# Patient Record
Sex: Male | Born: 1940
Health system: Southern US, Community
[De-identification: ages and names within clinical notes are randomized; demographics above are authoritative.]

## PROBLEM LIST (undated history)

## (undated) DIAGNOSIS — F4310 Post-traumatic stress disorder, unspecified: Secondary | ICD-10-CM

## (undated) DIAGNOSIS — M7541 Impingement syndrome of right shoulder: Secondary | ICD-10-CM

## (undated) DIAGNOSIS — R0602 Shortness of breath: Secondary | ICD-10-CM

## (undated) DIAGNOSIS — J189 Pneumonia, unspecified organism: Secondary | ICD-10-CM

## (undated) DIAGNOSIS — J449 Chronic obstructive pulmonary disease, unspecified: Secondary | ICD-10-CM

## (undated) DIAGNOSIS — F329 Major depressive disorder, single episode, unspecified: Secondary | ICD-10-CM

## (undated) DIAGNOSIS — F32A Depression, unspecified: Secondary | ICD-10-CM

## (undated) DIAGNOSIS — M1990 Unspecified osteoarthritis, unspecified site: Secondary | ICD-10-CM

## (undated) DIAGNOSIS — Z9981 Dependence on supplemental oxygen: Secondary | ICD-10-CM

## (undated) DIAGNOSIS — M19019 Primary osteoarthritis, unspecified shoulder: Secondary | ICD-10-CM

## (undated) HISTORY — PX: SUBDURAL HEMATOMA EVACUATION VIA CRANIOTOMY: SUR319

## (undated) HISTORY — PX: HEMORRHOID SURGERY: SHX153

## (undated) HISTORY — PX: KNEE ARTHROSCOPY: SUR90

## (undated) HISTORY — PX: COLONOSCOPY: SHX174

## (undated) HISTORY — PX: SHOULDER SURGERY: SHX246

## (undated) HISTORY — PX: FOOT SURGERY: SHX648

## (undated) HISTORY — PX: CARPAL TUNNEL RELEASE: SHX101

## (undated) HISTORY — PX: CERVICAL FUSION: SHX112

## (undated) HISTORY — PX: NOSE SURGERY: SHX723

## (undated) HISTORY — PX: APPENDECTOMY: SHX54

## (undated) HISTORY — PX: OTHER SURGICAL HISTORY: SHX169

## (undated) HISTORY — PX: EYE SURGERY: SHX253

## (undated) HISTORY — PX: PILONIDAL CYST / SINUS EXCISION: SUR543

---

## 2011-03-09 ENCOUNTER — Other Ambulatory Visit: Payer: Self-pay | Admitting: Orthopedic Surgery

## 2011-03-09 ENCOUNTER — Encounter (HOSPITAL_BASED_OUTPATIENT_CLINIC_OR_DEPARTMENT_OTHER)
Admission: RE | Admit: 2011-03-09 | Discharge: 2011-03-09 | Disposition: A | Discharge status: Home / Self Care | Payer: Medicare Other | Source: Ambulatory Visit | Attending: Orthopedic Surgery | Admitting: Orthopedic Surgery

## 2011-03-09 ENCOUNTER — Ambulatory Visit
Admission: RE | Admit: 2011-03-09 | Discharge: 2011-03-09 | Disposition: A | Discharge status: Home / Self Care | Payer: Medicare Other | Source: Ambulatory Visit | Attending: Orthopedic Surgery | Admitting: Orthopedic Surgery

## 2011-03-09 DIAGNOSIS — Z01811 Encounter for preprocedural respiratory examination: Secondary | ICD-10-CM

## 2011-03-12 ENCOUNTER — Ambulatory Visit (HOSPITAL_BASED_OUTPATIENT_CLINIC_OR_DEPARTMENT_OTHER)
Admission: RE | Admit: 2011-03-12 | Discharge: 2011-03-12 | Disposition: A | Discharge status: Home / Self Care | Payer: Medicare Other | Source: Ambulatory Visit | Attending: Orthopedic Surgery | Admitting: Orthopedic Surgery

## 2011-03-12 DIAGNOSIS — G5600 Carpal tunnel syndrome, unspecified upper limb: Secondary | ICD-10-CM | POA: Insufficient documentation

## 2011-03-12 DIAGNOSIS — Z01812 Encounter for preprocedural laboratory examination: Secondary | ICD-10-CM | POA: Insufficient documentation

## 2011-03-12 DIAGNOSIS — Z01818 Encounter for other preprocedural examination: Secondary | ICD-10-CM | POA: Insufficient documentation

## 2011-03-12 DIAGNOSIS — J449 Chronic obstructive pulmonary disease, unspecified: Secondary | ICD-10-CM | POA: Insufficient documentation

## 2011-03-12 DIAGNOSIS — Z01810 Encounter for preprocedural cardiovascular examination: Secondary | ICD-10-CM | POA: Insufficient documentation

## 2011-03-12 DIAGNOSIS — J4489 Other specified chronic obstructive pulmonary disease: Secondary | ICD-10-CM | POA: Insufficient documentation

## 2011-03-18 NOTE — Op Note (Signed)
NAME:  [name] [name], [name] [name]           ACCOUNT NO.:  [id number]  MEDICAL RECORD NO.:  [id number]  LOCATION:                                 FACILITY:  PHYSICIAN:  [name] [name]. Keith Felten, M.D.      DATE OF BIRTH:  DATE OF PROCEDURE:  03/12/2011 DATE OF DISCHARGE:                              OPERATIVE REPORT   PREOPERATIVE DIAGNOSIS:  Chronic severe entrapment neuropathy, right median nerve at carpal tunnel.  POSTOPERATIVE DIAGNOSIS:  Chronic severe entrapment neuropathy, right median nerve at carpal tunnel.  OPERATION:  Release of right transverse carpal ligament.  OPERATING SURGEON:  [name] [name]. [name] Roddy, MD  ASSISTANT:  [name] Reeks Dasnoit, PA-C  ANESTHESIA:  General by LMA.  SUPERVISING ANESTHESIOLOGIST:  [name] Silvan, MD  INDICATIONS:  [name] [name] is a 69 year old gentleman referred to Dr. Sherril [name] of [name], [name] for evaluation and management of chronic hand numbness.  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  Years ago he had a left carpal tunnel release.  During the past 5 years, he has had progressive right hand numbness.  He brought these symptoms to the attention of  Dr. Sherril [name] of [name] and was referred for detailed electrodiagnostic studies.  These revealed severe right carpal tunnel syndrome.  He was referred for an upper extremity orthopedic consult.  Clinical examination confirmed the presence of severe right carpal tunnel syndrome with marked prolongation of motor and sensory latencies and weakness of the thenar musculature.  We advised proceeding with release of the right transverse carpal ligament through small incision technique.  After informed consent, he is brought to the operating room at this time.  PROCEDURE:  [name] [name] was brought to room 1 of the [name] and placed in supine position on the operating table.  Following the induction of general  anesthesia by LMA technique, the right arm was prepped with Betadine soap and solution and sterilely draped.  Following routine surgical time-out, the right arm was exsanguinated with an Esmarch bandage and an arterial tourniquet on the proximal right brachium inflated to 250 mmHg.  Procedure commenced with a short incision in the line of the ring finger in the palm.  Subcutaneous tissues were carefully divided revealing the palmar fascia.  This was split longitudinally to reveal the common sensory branch of the median nerve.  These were followed back to the transverse carpal ligament which was gently isolated from the median nerve proper with the aid of a Penfield 4 Engineer, structural.  Once the pathway was created, superficial and deep to the transverse carpal ligament  scissors were used to release the ligament into the distal forearm widely opening the carpal canal.  An accessory volar carpal ligament was released with scissors under direct vision.  This fully decompressed the median nerve.  The tenosynovium of the ulnar bursa was quite thick and fibrotic.  Bleeding points along the margin of the released ligament were electrocauterized with bipolar current followed by repair of the skin with intradermal 3-0 Prolene suture.  A compressive dressing was applied with a volar plaster splint maintaining the wrist in 5 degrees of dorsiflexion.  For  aftercare, Mr. [name] [name] was advised to elevate his hand for the next 48 hours.  He is provided Percocet 7.5 mg 1 p.o. q.4-6 h. p.r.n. pain 24 tablets without refill.  We will see him back for followup in our office in a week for dressing change, probable suture removal, and instruction in his postoperative rehabilitation exercise program.     [name] [name]. [name] [name], M.D.     RVS/MEDQ  D:  03/12/2011  T:  03/12/2011  Job:  [id number]  cc:   [name] Beam, MD  Electronically Signed by [name] [name] M.D. on 03/18/2011 07:55:44 AM

## 2011-06-02 HISTORY — PX: JOINT REPLACEMENT: SHX530

## 2011-07-23 ENCOUNTER — Other Ambulatory Visit: Payer: Self-pay | Admitting: Orthopedic Surgery

## 2011-10-07 ENCOUNTER — Encounter (HOSPITAL_COMMUNITY): Payer: Self-pay | Admitting: Pharmacy Technician

## 2011-10-12 ENCOUNTER — Encounter (HOSPITAL_COMMUNITY): Payer: Self-pay

## 2011-10-12 ENCOUNTER — Encounter (HOSPITAL_COMMUNITY)
Admission: RE | Admit: 2011-10-12 | Discharge: 2011-10-12 | Disposition: A | Discharge status: Home / Self Care | Payer: Medicare Other | Source: Ambulatory Visit | Attending: Orthopedic Surgery | Admitting: Orthopedic Surgery

## 2011-10-12 HISTORY — DX: Major depressive disorder, single episode, unspecified: F32.9

## 2011-10-12 HISTORY — DX: Depression, unspecified: F32.A

## 2011-10-12 HISTORY — DX: Chronic obstructive pulmonary disease, unspecified: J44.9

## 2011-10-12 HISTORY — DX: Unspecified osteoarthritis, unspecified site: M19.90

## 2011-10-12 HISTORY — DX: Shortness of breath: R06.02

## 2011-10-12 LAB — CBC
HCT: 45.5 % (ref 39.0–52.0)
Hemoglobin: 14.9 g/dL (ref 13.0–17.0)
MCH: 30.1 pg (ref 26.0–34.0)
MCHC: 32.7 g/dL (ref 30.0–36.0)

## 2011-10-12 LAB — COMPREHENSIVE METABOLIC PANEL
BUN: 17 mg/dL (ref 6–23)
Calcium: 9.4 mg/dL (ref 8.4–10.5)
GFR calc Af Amer: >90 mL/min (ref >90)
Glucose, Bld: 98 mg/dL (ref 70–99)
Sodium: 141 mEq/L (ref 135–145)
Total Protein: 7.4 g/dL (ref 6.0–8.3)

## 2011-10-12 LAB — URINALYSIS, ROUTINE W REFLEX MICROSCOPIC
Leukocytes, UA: NEGATIVE
Nitrite: NEGATIVE
Protein, ur: NEGATIVE mg/dL
Urobilinogen, UA: 0.2 mg/dL (ref 0.0–1.0)

## 2011-10-12 LAB — PROTIME-INR
INR: 0.95 (ref 0.00–1.49)
Prothrombin Time: 12.9 seconds (ref 11.6–15.2)

## 2011-10-12 LAB — SURGICAL PCR SCREEN: MRSA, PCR: NEGATIVE

## 2011-10-12 NOTE — Pre-Procedure Instructions (Signed)
10/12 CXR and EKG-EPIC

## 2011-10-12 NOTE — Patient Instructions (Signed)
20 [name] [name]  10/12/2011   Your procedure is scheduled on:  10/19/11   Report to [facility] [place] at  AM.  Call this number if you have problems the morning of surgery: [phone]   Remember:   Do not eat food:After Midnight.  May have clear liquids:until Midnight .    Take these medicines the morning of surgery with A SIP OF WATER:   Do not wear jewelry,.  Do not wear lotions, powders, or perfumes.   Do not shave 48 hours prior to surgery. Men may shave face and neck.  Do not bring valuables to the hospital.  Contacts, dentures or bridgework may not be worn into surgery.  Leave suitcase in the car. After surgery it may be brought to your room.  For patients admitted to the hospital, checkout time is 11:00 AM the day of discharge.      Special Instructions: CHG Shower Use Special Wash: 1/2 bottle night before surgery and 1/2 bottle morning of surgery. shower chin to toes with CHG.  Wash face and private parts with regular soap.     Please read over the following fact sheets that you were given: MRSA Information, coughing and deep breathing exercises, leg exercises, Blood Transfusion Fact sheet, Incentive Spirometry Fact Sheet
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
## 2011-10-16 NOTE — Pre-Procedure Instructions (Signed)
10/16/11 Talked with patient and instructed patient that he could take Vicodin if needed am of surgery with sip of water if needed. Patient voiced understanding.

## 2011-10-19 ENCOUNTER — Encounter (HOSPITAL_COMMUNITY): Payer: Self-pay | Admitting: *Deleted

## 2011-10-19 ENCOUNTER — Ambulatory Visit (HOSPITAL_COMMUNITY): Payer: Medicare Other | Admitting: *Deleted

## 2011-10-19 ENCOUNTER — Encounter (HOSPITAL_COMMUNITY)
Admission: RE | Disposition: A | Discharge status: Home Health | Payer: Self-pay | Source: Ambulatory Visit | Attending: Orthopedic Surgery

## 2011-10-19 ENCOUNTER — Inpatient Hospital Stay (HOSPITAL_COMMUNITY)
Admission: RE | Admit: 2011-10-19 | Discharge: 2011-10-22 | DRG: 470 | Disposition: A | Discharge status: Home Health | Payer: Medicare Other | Source: Ambulatory Visit | Attending: Orthopedic Surgery | Admitting: Orthopedic Surgery

## 2011-10-19 DIAGNOSIS — J449 Chronic obstructive pulmonary disease, unspecified: Secondary | ICD-10-CM | POA: Diagnosis present

## 2011-10-19 DIAGNOSIS — F3289 Other specified depressive episodes: Secondary | ICD-10-CM | POA: Diagnosis present

## 2011-10-19 DIAGNOSIS — J4489 Other specified chronic obstructive pulmonary disease: Secondary | ICD-10-CM | POA: Diagnosis present

## 2011-10-19 DIAGNOSIS — Z96659 Presence of unspecified artificial knee joint: Secondary | ICD-10-CM

## 2011-10-19 DIAGNOSIS — Z01812 Encounter for preprocedural laboratory examination: Secondary | ICD-10-CM

## 2011-10-19 DIAGNOSIS — D649 Anemia, unspecified: Secondary | ICD-10-CM | POA: Diagnosis not present

## 2011-10-19 DIAGNOSIS — F329 Major depressive disorder, single episode, unspecified: Secondary | ICD-10-CM | POA: Diagnosis present

## 2011-10-19 DIAGNOSIS — M1710 Unilateral primary osteoarthritis, unspecified knee: Secondary | ICD-10-CM | POA: Diagnosis present

## 2011-10-19 HISTORY — PX: TOTAL KNEE ARTHROPLASTY: SHX125

## 2011-10-19 LAB — ABO/RH: ABO/RH(D): A NEG

## 2011-10-19 SURGERY — ARTHROPLASTY, KNEE, TOTAL
Anesthesia: Spinal | Site: Knee | Laterality: Left | Wound class: Clean

## 2011-10-19 MED ORDER — DEXTROSE-NACL 5-0.9 % IV SOLN
INTRAVENOUS | Status: DC
  Administered 2011-10-19: 18:00:00 via INTRAVENOUS
  Administered 2011-10-20: 20 mL/h via INTRAVENOUS
  Administered 2011-10-21: 01:00:00 via INTRAVENOUS

## 2011-10-19 MED ORDER — LACTATED RINGERS IV SOLN
INTRAVENOUS | Status: DC
  Administered 2011-10-19: 1000 mL via INTRAVENOUS
  Administered 2011-10-19: 15:00:00 via INTRAVENOUS

## 2011-10-19 MED ORDER — LORATADINE 10 MG PO TABS
10.0000 mg | ORAL_TABLET | Freq: Every day | ORAL | Status: DC
  Administered 2011-10-19 – 2011-10-22 (×4): 10 mg via ORAL
  Filled 2011-10-19 (×4): qty 1

## 2011-10-19 MED ORDER — PROPOFOL 10 MG/ML IV EMUL
INTRAVENOUS | Status: DC | PRN
  Administered 2011-10-19: 140 ug/kg/min via INTRAVENOUS

## 2011-10-19 MED ORDER — ONDANSETRON HCL 4 MG PO TABS: 4.0000 mg | ORAL_TABLET | Freq: Four times a day (QID) | ORAL | Status: DC | PRN

## 2011-10-19 MED ORDER — METOCLOPRAMIDE HCL 10 MG PO TABS: 5.0000 mg | ORAL_TABLET | Freq: Three times a day (TID) | ORAL | Status: DC | PRN

## 2011-10-19 MED ORDER — ONDANSETRON HCL 4 MG/2ML IJ SOLN: 4.0000 mg | Freq: Four times a day (QID) | INTRAMUSCULAR | Status: DC | PRN

## 2011-10-19 MED ORDER — PAROXETINE HCL 20 MG PO TABS
20.0000 mg | ORAL_TABLET | Freq: Every day | ORAL | Status: DC
  Administered 2011-10-20 – 2011-10-22 (×3): 20 mg via ORAL
  Filled 2011-10-19 (×4): qty 1

## 2011-10-19 MED ORDER — METHOCARBAMOL 500 MG PO TABS
500.0000 mg | ORAL_TABLET | Freq: Four times a day (QID) | ORAL | Status: DC | PRN
  Administered 2011-10-20 – 2011-10-22 (×6): 500 mg via ORAL
  Filled 2011-10-19 (×6): qty 1

## 2011-10-19 MED ORDER — MENTHOL 3 MG MT LOZG
1.0000 | LOZENGE | OROMUCOSAL | Status: DC | PRN
  Filled 2011-10-19 (×2): qty 9

## 2011-10-19 MED ORDER — CHLORHEXIDINE GLUCONATE 4 % EX LIQD
60.0000 mL | Freq: Once | CUTANEOUS | Status: DC
  Filled 2011-10-19: qty 60

## 2011-10-19 MED ORDER — DOCUSATE SODIUM 100 MG PO CAPS
100.0000 mg | ORAL_CAPSULE | Freq: Two times a day (BID) | ORAL | Status: DC
  Administered 2011-10-19 – 2011-10-22 (×6): 100 mg via ORAL

## 2011-10-19 MED ORDER — RIVAROXABAN 10 MG PO TABS
10.0000 mg | ORAL_TABLET | Freq: Every day | ORAL | Status: DC
  Administered 2011-10-20 – 2011-10-22 (×3): 10 mg via ORAL
  Filled 2011-10-19 (×4): qty 1

## 2011-10-19 MED ORDER — MORPHINE SULFATE (PF) 1 MG/ML IV SOLN
INTRAVENOUS | Status: DC
  Administered 2011-10-19 (×2): 1 mg via INTRAVENOUS
  Administered 2011-10-20: 3 mg via INTRAVENOUS
  Administered 2011-10-20: 6 mg via INTRAVENOUS
  Administered 2011-10-20: 8 mg via INTRAVENOUS

## 2011-10-19 MED ORDER — TEMAZEPAM 15 MG PO CAPS: 15.0000 mg | ORAL_CAPSULE | Freq: Every evening | ORAL | Status: DC | PRN

## 2011-10-19 MED ORDER — FEXOFENADINE-PSEUDOEPHED ER 60-120 MG PO TB12: 1.0000 | ORAL_TABLET | Freq: Two times a day (BID) | ORAL | Status: DC

## 2011-10-19 MED ORDER — PHENOL 1.4 % MT LIQD
1.0000 | OROMUCOSAL | Status: DC | PRN
  Filled 2011-10-19: qty 177

## 2011-10-19 MED ORDER — ACETAMINOPHEN 325 MG PO TABS: 650.0000 mg | ORAL_TABLET | Freq: Four times a day (QID) | ORAL | Status: DC | PRN

## 2011-10-19 MED ORDER — CEFAZOLIN SODIUM 1-5 GM-% IV SOLN
1.0000 g | Freq: Four times a day (QID) | INTRAVENOUS | Status: AC
  Administered 2011-10-19 – 2011-10-20 (×3): 1 g via INTRAVENOUS
  Filled 2011-10-19 (×3): qty 50

## 2011-10-19 MED ORDER — DEXTROSE 5 % IV SOLN
500.0000 mg | Freq: Four times a day (QID) | INTRAVENOUS | Status: DC | PRN
  Filled 2011-10-19: qty 5

## 2011-10-19 MED ORDER — MIDAZOLAM HCL 5 MG/5ML IJ SOLN
INTRAMUSCULAR | Status: DC | PRN
  Administered 2011-10-19: 2 mg via INTRAVENOUS

## 2011-10-19 MED ORDER — PSEUDOEPHEDRINE HCL ER 120 MG PO TB12
120.0000 mg | ORAL_TABLET | Freq: Two times a day (BID) | ORAL | Status: DC
  Administered 2011-10-19 – 2011-10-22 (×6): 120 mg via ORAL
  Filled 2011-10-19 (×7): qty 1

## 2011-10-19 MED ORDER — TIOTROPIUM BROMIDE MONOHYDRATE 18 MCG IN CAPS
18.0000 ug | ORAL_CAPSULE | Freq: Every day | RESPIRATORY_TRACT | Status: DC
  Administered 2011-10-19 – 2011-10-22 (×4): 18 ug via RESPIRATORY_TRACT
  Filled 2011-10-19: qty 5

## 2011-10-19 MED ORDER — BUPIVACAINE IN DEXTROSE 0.75-8.25 % IT SOLN
INTRATHECAL | Status: DC | PRN
  Administered 2011-10-19: 10 mg via INTRATHECAL

## 2011-10-19 MED ORDER — DIPHENHYDRAMINE HCL 12.5 MG/5ML PO ELIX: 12.5000 mg | ORAL_SOLUTION | ORAL | Status: DC | PRN

## 2011-10-19 MED ORDER — POLYETHYLENE GLYCOL 3350 17 G PO PACK: 17.0000 g | PACK | Freq: Every day | ORAL | Status: DC | PRN

## 2011-10-19 MED ORDER — SODIUM CHLORIDE 0.9 % IJ SOLN: 9.0000 mL | INTRAMUSCULAR | Status: DC | PRN

## 2011-10-19 MED ORDER — ACETAMINOPHEN 650 MG RE SUPP: 650.0000 mg | Freq: Four times a day (QID) | RECTAL | Status: DC | PRN

## 2011-10-19 MED ORDER — SODIUM CHLORIDE 0.9 % IV SOLN: INTRAVENOUS | Status: DC

## 2011-10-19 MED ORDER — ACETAMINOPHEN 10 MG/ML IV SOLN
INTRAVENOUS | Status: AC
  Filled 2011-10-19: qty 100

## 2011-10-19 MED ORDER — DIPHENHYDRAMINE HCL 50 MG/ML IJ SOLN: 12.5000 mg | Freq: Four times a day (QID) | INTRAMUSCULAR | Status: DC | PRN

## 2011-10-19 MED ORDER — FLUTICASONE-SALMETEROL 115-21 MCG/ACT IN AERO
2.0000 | INHALATION_SPRAY | Freq: Two times a day (BID) | RESPIRATORY_TRACT | Status: DC
  Filled 2011-10-19: qty 8

## 2011-10-19 MED ORDER — MORPHINE SULFATE (PF) 1 MG/ML IV SOLN
INTRAVENOUS | Status: AC
  Administered 2011-10-20: 3.46 mg via INTRAVENOUS
  Filled 2011-10-19: qty 25

## 2011-10-19 MED ORDER — ACETAMINOPHEN 10 MG/ML IV SOLN
1000.0000 mg | Freq: Four times a day (QID) | INTRAVENOUS | Status: AC
  Administered 2011-10-19 – 2011-10-20 (×4): 1000 mg via INTRAVENOUS
  Filled 2011-10-19 (×6): qty 100

## 2011-10-19 MED ORDER — BUPIVACAINE ON-Q PAIN PUMP (FOR ORDER SET NO CHG)
INJECTION | Status: DC
  Filled 2011-10-19: qty 1

## 2011-10-19 MED ORDER — BISACODYL 10 MG RE SUPP: 10.0000 mg | Freq: Every day | RECTAL | Status: DC | PRN

## 2011-10-19 MED ORDER — OXYCODONE HCL 5 MG PO TABS
5.0000 mg | ORAL_TABLET | ORAL | Status: DC | PRN
  Administered 2011-10-20 (×2): 10 mg via ORAL
  Administered 2011-10-20: 5 mg via ORAL
  Administered 2011-10-21 – 2011-10-22 (×5): 10 mg via ORAL
  Administered 2011-10-22: 5 mg via ORAL
  Administered 2011-10-22: 10 mg via ORAL
  Filled 2011-10-19 (×6): qty 2
  Filled 2011-10-19 (×2): qty 1
  Filled 2011-10-19 (×2): qty 2

## 2011-10-19 MED ORDER — DEXAMETHASONE SODIUM PHOSPHATE 10 MG/ML IJ SOLN
INTRAMUSCULAR | Status: DC | PRN
  Administered 2011-10-19: 10 mg via INTRAVENOUS

## 2011-10-19 MED ORDER — ACETAMINOPHEN 10 MG/ML IV SOLN
1000.0000 mg | Freq: Once | INTRAVENOUS | Status: AC
  Administered 2011-10-19: 1000 mg via INTRAVENOUS
  Filled 2011-10-19: qty 100

## 2011-10-19 MED ORDER — CEFAZOLIN SODIUM-DEXTROSE 2-3 GM-% IV SOLR
INTRAVENOUS | Status: AC
  Filled 2011-10-19: qty 50

## 2011-10-19 MED ORDER — HETASTARCH-ELECTROLYTES 6 % IV SOLN
INTRAVENOUS | Status: DC | PRN
  Administered 2011-10-19: 14:00:00 via INTRAVENOUS

## 2011-10-19 MED ORDER — BUPIVACAINE 0.25 % ON-Q PUMP SINGLE CATH 300ML
300.0000 mL | INJECTION | Status: DC
  Filled 2011-10-19: qty 300

## 2011-10-19 MED ORDER — METOCLOPRAMIDE HCL 5 MG/ML IJ SOLN: 5.0000 mg | Freq: Three times a day (TID) | INTRAMUSCULAR | Status: DC | PRN

## 2011-10-19 MED ORDER — ZOLPIDEM TARTRATE 5 MG PO TABS
5.0000 mg | ORAL_TABLET | Freq: Every evening | ORAL | Status: DC | PRN
  Administered 2011-10-22: 5 mg via ORAL
  Filled 2011-10-19: qty 1

## 2011-10-19 MED ORDER — HYDROMORPHONE HCL PF 1 MG/ML IJ SOLN: 0.2500 mg | INTRAMUSCULAR | Status: DC | PRN

## 2011-10-19 MED ORDER — DIPHENHYDRAMINE HCL 12.5 MG/5ML PO ELIX: 12.5000 mg | ORAL_SOLUTION | Freq: Four times a day (QID) | ORAL | Status: DC | PRN

## 2011-10-19 MED ORDER — BUPIVACAINE 0.25 % ON-Q PUMP SINGLE CATH 300ML
INJECTION | Status: AC
  Filled 2011-10-19: qty 300

## 2011-10-19 MED ORDER — FENTANYL CITRATE 0.05 MG/ML IJ SOLN
INTRAMUSCULAR | Status: DC | PRN
  Administered 2011-10-19: 50 ug via INTRAVENOUS
  Administered 2011-10-19: 100 ug via INTRAVENOUS

## 2011-10-19 MED ORDER — ALBUTEROL SULFATE (5 MG/ML) 0.5% IN NEBU
2.5000 mg | INHALATION_SOLUTION | Freq: Four times a day (QID) | RESPIRATORY_TRACT | Status: DC | PRN
  Administered 2011-10-21: 2.5 mg via RESPIRATORY_TRACT
  Filled 2011-10-19: qty 0.5

## 2011-10-19 MED ORDER — ONDANSETRON HCL 4 MG/2ML IJ SOLN
INTRAMUSCULAR | Status: DC | PRN
  Administered 2011-10-19: 4 mg via INTRAVENOUS

## 2011-10-19 MED ORDER — DEXAMETHASONE SODIUM PHOSPHATE 10 MG/ML IJ SOLN
10.0000 mg | Freq: Once | INTRAMUSCULAR | Status: DC
  Filled 2011-10-19: qty 1

## 2011-10-19 MED ORDER — CEFAZOLIN SODIUM-DEXTROSE 2-3 GM-% IV SOLR
2.0000 g | INTRAVENOUS | Status: AC
  Administered 2011-10-19: 2 g via INTRAVENOUS

## 2011-10-19 MED ORDER — SODIUM CHLORIDE 0.9 % IR SOLN
Status: DC | PRN
  Administered 2011-10-19: 1000 mL

## 2011-10-19 MED ORDER — NALOXONE HCL 0.4 MG/ML IJ SOLN: 0.4000 mg | INTRAMUSCULAR | Status: DC | PRN

## 2011-10-19 MED ORDER — BUPIVACAINE 0.25 % ON-Q PUMP SINGLE CATH 300ML
INJECTION | Status: DC | PRN
  Administered 2011-10-19: 300 mL

## 2011-10-19 SURGICAL SUPPLY — 51 items
BAG ZIPLOCK 12X15 (MISCELLANEOUS) ×2 IMPLANT
BANDAGE ELASTIC 6 VELCRO ST LF (GAUZE/BANDAGES/DRESSINGS) ×2 IMPLANT
BANDAGE ESMARK 6X9 LF (GAUZE/BANDAGES/DRESSINGS) ×1 IMPLANT
BLADE SAG 18X100X1.27 (BLADE) ×2 IMPLANT
BLADE SAW SGTL 11.0X1.19X90.0M (BLADE) ×2 IMPLANT
BNDG ESMARK 6X9 LF (GAUZE/BANDAGES/DRESSINGS) ×2
BOWL SMART MIX CTS (DISPOSABLE) ×2 IMPLANT
CATH KIT ON-Q SILVERSOAK 5IN (CATHETERS) ×2 IMPLANT
CEMENT HV SMART SET (Cement) ×4 IMPLANT
CLOTH BEACON ORANGE TIMEOUT ST (SAFETY) ×2 IMPLANT
CUFF TOURN SGL QUICK 34 (TOURNIQUET CUFF) ×1
CUFF TRNQT CYL 34X4X40X1 (TOURNIQUET CUFF) ×1 IMPLANT
DRAPE EXTREMITY T 121X128X90 (DRAPE) ×2 IMPLANT
DRAPE POUCH INSTRU U-SHP 10X18 (DRAPES) ×2 IMPLANT
DRAPE U-SHAPE 47X51 STRL (DRAPES) ×2 IMPLANT
DRSG ADAPTIC 3X8 NADH LF (GAUZE/BANDAGES/DRESSINGS) ×2 IMPLANT
DRSG PAD ABDOMINAL 8X10 ST (GAUZE/BANDAGES/DRESSINGS) ×2 IMPLANT
DURAPREP 26ML APPLICATOR (WOUND CARE) ×2 IMPLANT
ELECT REM PT RETURN 9FT ADLT (ELECTROSURGICAL) ×2
ELECTRODE REM PT RTRN 9FT ADLT (ELECTROSURGICAL) ×1 IMPLANT
EVACUATOR 1/8 PVC DRAIN (DRAIN) ×2 IMPLANT
FACESHIELD LNG OPTICON STERILE (SAFETY) ×10 IMPLANT
GLOVE BIO SURGEON STRL SZ7.5 (GLOVE) ×2 IMPLANT
GLOVE BIO SURGEON STRL SZ8 (GLOVE) ×2 IMPLANT
GLOVE BIOGEL PI IND STRL 8 (GLOVE) ×2 IMPLANT
GLOVE BIOGEL PI INDICATOR 8 (GLOVE) ×2
GOWN STRL NON-REIN LRG LVL3 (GOWN DISPOSABLE) ×2 IMPLANT
GOWN STRL REIN XL XLG (GOWN DISPOSABLE) ×2 IMPLANT
HANDPIECE INTERPULSE COAX TIP (DISPOSABLE) ×1
IMMOBILIZER KNEE 20 (SOFTGOODS) ×2
IMMOBILIZER KNEE 20 THIGH 36 (SOFTGOODS) ×1 IMPLANT
KIT BASIN OR (CUSTOM PROCEDURE TRAY) ×2 IMPLANT
MANIFOLD NEPTUNE II (INSTRUMENTS) ×2 IMPLANT
NS IRRIG 1000ML POUR BTL (IV SOLUTION) ×2 IMPLANT
PACK TOTAL JOINT (CUSTOM PROCEDURE TRAY) ×2 IMPLANT
PAD ABD 7.5X8 STRL (GAUZE/BANDAGES/DRESSINGS) ×2 IMPLANT
PADDING CAST COTTON 6X4 STRL (CAST SUPPLIES) ×4 IMPLANT
POSITIONER SURGICAL ARM (MISCELLANEOUS) ×2 IMPLANT
SET HNDPC FAN SPRY TIP SCT (DISPOSABLE) ×1 IMPLANT
SPONGE GAUZE 4X4 12PLY (GAUZE/BANDAGES/DRESSINGS) ×2 IMPLANT
STRIP CLOSURE SKIN 1/2X4 (GAUZE/BANDAGES/DRESSINGS) ×4 IMPLANT
SUCTION FRAZIER 12FR DISP (SUCTIONS) ×2 IMPLANT
SUT MNCRL AB 4-0 PS2 18 (SUTURE) ×2 IMPLANT
SUT PDS AB 1 CT1 27 (SUTURE) ×6 IMPLANT
SUT VIC AB 2-0 CT1 27 (SUTURE) ×3
SUT VIC AB 2-0 CT1 TAPERPNT 27 (SUTURE) ×3 IMPLANT
SUT VLOC 180 0 24IN GS25 (SUTURE) ×2 IMPLANT
TOWEL OR 17X26 10 PK STRL BLUE (TOWEL DISPOSABLE) ×4 IMPLANT
TRAY FOLEY CATH 14FRSI W/METER (CATHETERS) ×2 IMPLANT
WATER STERILE IRR 1500ML POUR (IV SOLUTION) ×2 IMPLANT
WRAP KNEE MAXI GEL POST OP (GAUZE/BANDAGES/DRESSINGS) ×4 IMPLANT

## 2011-10-19 NOTE — Interval H&P Note (Signed)
History and Physical Interval Note:  10/19/2011 1:35 PM  [name] [name]  has presented today for surgery, with the diagnosis of osteoarthtitis left knee   The various methods of treatment have been discussed with the patient and family. After consideration of risks, benefits and other options for treatment, the patient has consented to  Procedure(s) (LRB): TOTAL KNEE ARTHROPLASTY (Left) as a surgical intervention .  The patients' history has been reviewed, patient examined, no change in status, stable for surgery.  I have reviewed the patients' chart and labs.  Questions were answered to the patient's satisfaction.     Loanne Drilling

## 2011-10-19 NOTE — Preoperative (Signed)
Beta Blockers   Reason not to administer Beta Blockers:Not Applicable 

## 2011-10-19 NOTE — Anesthesia Procedure Notes (Signed)
Spinal  Patient location during procedure: OR Start time: 10/19/2011 1:39 PM End time: 10/19/2011 1:49 PM Staffing Anesthesiologist: Lestine Box B Performed by: anesthesiologist  Preanesthetic Checklist Completed: patient identified, site marked, surgical consent, pre-op evaluation, timeout performed, IV checked, risks and benefits discussed and monitors and equipment checked Spinal Block Patient position: sitting Prep: Betadine and site prepped and draped Patient monitoring: heart rate, cardiac monitor, continuous pulse ox and blood pressure Approach: midline Location: L3-4 Injection technique: single-shot Needle Needle type: Quincke  Needle gauge: 25 G Needle length: 10 cm Catheter at skin depth: 4 cm Assessment Sensory level: T6 Additional Notes [id number], 2014-09

## 2011-10-19 NOTE — Progress Notes (Signed)
Hx COPD and is short of breath today but pt states his breathing is good today.

## 2011-10-19 NOTE — Anesthesia Postprocedure Evaluation (Signed)
Anesthesia Post Note  Patient: [name] [name]  Procedure(s) Performed: Procedure(s) (LRB): TOTAL KNEE ARTHROPLASTY (Left)  Anesthesia type: General  Patient location: PACU  Post pain: Pain level controlled  Post assessment: Post-op Vital signs reviewed  Last Vitals:  Filed Vitals:   10/19/11 1645  BP: 112/70  Pulse: 62  Temp: 36.4 C  Resp: 16    Post vital signs: Reviewed  Level of consciousness: sedated  Complications: No apparent anesthesia complications

## 2011-10-19 NOTE — Op Note (Signed)
Pre-operative diagnosis- Osteoarthritis  Left knee(s)  Post-operative diagnosis- Osteoarthritis Left knee(s)  Procedure-  Left Total Knee Arthroplasty  Surgeon- Gus Rankin. Venicia Vandall, MD  Assistant- Avel Peace, PA-C   Anesthesia-  Spinal EBL-* No blood loss amount entered *  Drains Hemovac  Tourniquet time-  Total Tourniquet Time Documented: Thigh (Left) - 42 minutes   Complications- None  Condition-PACU - hemodynamically stable.   Brief Clinical Note  [name] [name] is a 71 y.o. year old male with end stage OA of his right knee with progressively worsening pain and dysfunction. He has constant pain, with activity and at rest and significant functional deficits with difficulties even with ADLs. He has had extensive non-op management including analgesics, injections of cortisone , and home exercise program, but remains in significant pain with significant dysfunction. Radiographs show bone on bone arthritis medial and patellofemoral with large medial osteophytes.He presents now for right Total Knee Arthroplasty.    Procedure in detail---   The patient is brought into the operating room and positioned supine on the operating table. After successful administration of  Spinal,   a tourniquet is placed high on the  Left thigh(s) and the lower extremity is prepped and draped in the usual sterile fashion. Time out is performed by the operating team and then the  Left lower extremity is wrapped in Esmarch, knee flexed and the tourniquet inflated to 300 mmHg.       A midline incision is made with a ten blade through the subcutaneous tissue to the level of the extensor mechanism. A fresh blade is used to make a medial parapatellar arthrotomy. Soft tissue over the proximal medial tibia is subperiosteally elevated to the joint line with a knife and into the semimembranosus bursa with a Cobb elevator. Soft tissue over the proximal lateral tibia is elevated with attention being paid to avoiding the  patellar tendon on the tibial tubercle. The patella is everted, knee flexed 90 degrees and the ACL and PCL are removed. Findings are bone on bone all 3 compartments with large osteophyte formation.        The drill is used to create a starting hole in the distal femur and the canal is thoroughly irrigated with sterile saline to remove the fatty contents. The 5 degree Left  valgus alignment guide is placed into the femoral canal and the distal femoral cutting block is pinned to remove 11 mm off the distal femur. Resection is made with an oscillating saw.      The tibia is subluxed forward and the menisci are removed. The extramedullary alignment guide is placed referencing proximally at the medial aspect of the tibial tubercle and distally along the second metatarsal axis and tibial crest. The block is pinned to remove 2mm off the more deficient medial  side. Resection is made with an oscillating saw. Size 5is the most appropriate size for the tibia and the proximal tibia is prepared with the modular drill and keel punch for that size.      The femoral sizing guide is placed and size 6 is most appropriate. Rotation is marked off the epicondylar axis and confirmed by creating a rectangular flexion gap at 90 degrees. The size 6 cutting block is pinned in this rotation and the anterior, posterior and chamfer cuts are made with the oscillating saw. The intercondylar block is then placed and that cut is made.      Trial size 5 tibial component, trial size 6 posterior stabilized femur and a 10  mm posterior stabilized rotating platform insert trial is placed. Full extension is achieved with excellent varus/valgus and anterior/posterior balance throughout full range of motion. The patella is everted and thickness measured to be 27  mm. Free hand resection is taken to 15 mm, a 41 template is placed, lug holes are drilled, trial patella is placed, and it tracks normally. Osteophytes are removed off the posterior femur  with the trial in place. All trials are removed and the cut bone surfaces prepared with pulsatile lavage. Cement is mixed and once ready for implantation, the size 5 tibial implant, size  6 posterior stabilized femoral component, and the size 41 patella are cemented in place and the patella is held with the clamp. The trial insert is placed and the knee held in full extension. All extruded cement is removed and once the cement is hard the permanent 10 mm posterior stabilized rotating platform insert is placed into the tibial tray.      The wound is copiously irrigated with saline solution and the extensor mechanism closed over a hemovac drain with #1 PDS suture. The tourniquet is released for a total tourniquet time of 42  minutes. Flexion against gravity is 140 degrees and the patella tracks normally. Subcutaneous tissue is closed with 2.0 vicryl and subcuticular with running 4.0 Monocryl. The catheter for the Marcaine pain pump is placed and the pump is initiated. The incision is cleaned and dried and steri-strips and a bulky sterile dressing are applied. The limb is placed into a knee immobilizer and the patient is awakened and transported to recovery in stable condition.      Please note that a surgical assistant was a medical necessity for this procedure in order to perform it in a safe and expeditious manner. Surgical assistant was necessary to retract the ligaments and vital neurovascular structures to prevent injury to them and also necessary for proper positioning of the limb to allow for anatomic placement of the prosthesis.   Gus Rankin Dewanna Hurston, MD    10/19/2011, 2:56 PM

## 2011-10-19 NOTE — Transfer of Care (Signed)
Immediate Anesthesia Transfer of Care Note  Patient: [name] [name]  Procedure(s) Performed: Procedure(s) (LRB): TOTAL KNEE ARTHROPLASTY (Left)  Patient Location: PACU  Anesthesia Type: Spinal  Level of Consciousness: awake, sedated and patient cooperative  Airway & Oxygen Therapy: Patient Spontanous Breathing and Patient connected to face mask oxygen  Post-op Assessment: Report given to PACU RN, Post -op Vital signs reviewed and stable and SAB level T12  Post vital signs: Reviewed and stable  Complications: No apparent anesthesia complications

## 2011-10-19 NOTE — Anesthesia Preprocedure Evaluation (Signed)
Anesthesia Evaluation  Patient identified by MRN, date of birth, ID band Patient awake    Reviewed: Allergy & Precautions, H&P , NPO status , Patient's Chart, lab work & pertinent test results, reviewed documented beta blocker date and time   Airway Mallampati: II TM Distance: >3 FB Neck ROM: Full    Dental  (+) Dental Advisory Given and Teeth Intact   Pulmonary COPD COPD inhaler, former smoker breath sounds clear to auscultation        Cardiovascular negative cardio ROS  Rhythm:Regular Rate:Normal  Denies cardiac symptoms   Neuro/Psych negative neurological ROS  negative psych ROS   GI/Hepatic negative GI ROS, Neg liver ROS,   Endo/Other  negative endocrine ROS  Renal/GU negative Renal ROS  negative genitourinary   Musculoskeletal   Abdominal   Peds negative pediatric ROS (+)  Hematology negative hematology ROS (+)   Anesthesia Other Findings Upper front caps  Reproductive/Obstetrics negative OB ROS                           Anesthesia Physical Anesthesia Plan  ASA: III  Anesthesia Plan: Spinal   Post-op Pain Management:    Induction: Intravenous  Airway Management Planned: Mask  Additional Equipment:   Intra-op Plan:   Post-operative Plan:   Informed Consent: I have reviewed the patients History and Physical, chart, labs and discussed the procedure including the risks, benefits and alternatives for the proposed anesthesia with the patient or authorized representative who has indicated his/her understanding and acceptance.   Dental advisory given  Plan Discussed with: CRNA and Surgeon  Anesthesia Plan Comments:         Anesthesia Quick Evaluation

## 2011-10-20 ENCOUNTER — Encounter (HOSPITAL_COMMUNITY): Payer: Self-pay | Admitting: Orthopedic Surgery

## 2011-10-20 LAB — BASIC METABOLIC PANEL
BUN: 10 mg/dL (ref 6–23)
Creatinine, Ser: 0.75 mg/dL (ref 0.50–1.35)
GFR calc Af Amer: >90 mL/min (ref >90)
GFR calc non Af Amer: >90 mL/min (ref >90)
Potassium: 4.2 mEq/L (ref 3.5–5.1)

## 2011-10-20 LAB — CBC
HCT: 31.4 % — ABNORMAL LOW (ref 39.0–52.0)
MCHC: 33.4 g/dL (ref 30.0–36.0)
MCV: 91.3 fL (ref 78.0–100.0)
Platelets: 194 10*3/uL (ref 150–400)
RDW: 13 % (ref 11.5–15.5)

## 2011-10-20 MED ORDER — MORPHINE SULFATE 2 MG/ML IJ SOLN
1.0000 mg | INTRAMUSCULAR | Status: DC | PRN
  Administered 2011-10-22: 2 mg via INTRAVENOUS
  Filled 2011-10-20: qty 1

## 2011-10-20 NOTE — Anesthesia Postprocedure Evaluation (Signed)
  Anesthesia Post-op Note  Patient: [name] [name]  Procedure(s) Performed: Procedure(s) (LRB): TOTAL KNEE ARTHROPLASTY (Left)  Patient Location: PACU  Anesthesia Type: Spinal  Level of Consciousness: oriented and sedated  Airway and Oxygen Therapy: Patient Spontanous Breathing and Patient connected to nasal cannula oxygen  Post-op Pain: mild  Post-op Assessment: Post-op Vital signs reviewed, Patient's Cardiovascular Status Stable, Respiratory Function Stable and Patent Airway  Post-op Vital Signs: stable  Complications: No apparent anesthesia complications

## 2011-10-20 NOTE — Care Management Note (Signed)
    Page 1 of 2   10/22/2011     3:39:52 PM   CARE MANAGEMENT NOTE 10/22/2011  Patient:  [name],[name]   Account Number:  [id number]  Date Initiated:  10/20/2011  Documentation initiated by:  Colleen Can  Subjective/Objective Assessment:   DX OSTEOARTHRITIS LEFT KNEE: TOTAL KNEE REPLACEMNT     Action/Plan:   CM spoke with patientand spouse. Plans are for pt to return tio his home in [place] where spouse will be caregiver. States he plans to use [place] for [facility] services. Was rrranged prior to [facility] admission.States he already has DME   Anticipated DC Date:  10/21/2011   Anticipated DC Plan:  HOME W HOME HEALTH SERVICES  In-house referral  NA      DC Planning Services  CM consult      [facility] Choice  HOME HEALTH   Choice offered to / List presented to:  C-1 Patient   DME arranged  NA      DME agency  NA     HH arranged  HH-2 PT      [facility] agency  [facility]   Status of service:  Completed, signed off Medicare Important Message given?  NA - LOS <3 / Initial given by admissions (If response is "NO", the following Medicare IM given date fields will be blank) Date Medicare IM given:   Date Additional Medicare IM given:    Discharge Disposition:    Per UR Regulation:    If discussed at Long Length of Stay Meetings, dates discussed:    Comments:  10/22/2011 [name] BSN CCM [phone] Anticipate d/c today. [facility] will provide [facility] services .  10/20/2011 [name] BSN CCM [phone] List of HH agencies placed in shadow chart. Genevieve Norlander will start [facility] services day after discharge.

## 2011-10-20 NOTE — Progress Notes (Signed)
OT Note:  Pt does not feel he needs OT.  He has had several knee surgeries and has a walk in shower.  Reviewed sequence with pt/wife.  They said a 3:1 was ordered for them.  [place], [place] [phone] 10/20/2011

## 2011-10-20 NOTE — Progress Notes (Signed)
Utilization review completed.  

## 2011-10-20 NOTE — Evaluation (Signed)
Physical Therapy Evaluation Patient Details Name: [name] [name] MRN: [id number] DOB: 1940/12/03 Today's Date: 10/20/2011 Time: 2952-8413 PT Time Calculation (min): 20 min  PT Assessment / Plan / Recommendation Clinical Impression  pt is s/p LTKA who tolerated ambulation first time up. pt will benefit from PT to improve in functional mobility and ROM to DC to home.    PT Assessment  Patient needs continued PT services    Follow Up Recommendations  Home health PT;Supervision/Assistance - 24 hour    Barriers to Discharge        lEquipment Recommendations  3 in 1 bedside comode    Recommendations for Other Services OT consult   Frequency 7X/week    Precautions / Restrictions Precautions Precautions: Knee Required Braces or Orthoses: Knee Immobilizer - Left   Pertinent Vitals/Pain 4/10 L knee, ice applied      Mobility  Bed Mobility Bed Mobility: Supine to Sit Supine to Sit: 4: Min guard;HOB flat Details for Bed Mobility Assistance: vc for saftety,  Transfers Transfers: Sit to Stand;Stand to Sit Sit to Stand: 4: Min assist;From bed;From elevated surface;With upper extremity assist Stand to Sit: 4: Min assist;With upper extremity assist;To chair/3-in-1;With armrests Details for Transfer Assistance: vc for hand placement to push up/reach back Ambulation/Gait Ambulation/Gait Assistance: 4: Min assist Ambulation Distance (Feet): 100 Feet Assistive device: Rolling walker Ambulation/Gait Assistance Details: VC for sequence and step length,  pursed lip breathe. Gait Pattern: Step-through pattern    Exercises Total Joint Exercises Ankle Circles/Pumps: AROM;Both;10 reps Quad Sets: AROM;10 reps;Left Straight Leg Raises: AROM;Left;10 reps;Supine Knee Flexion: AAROM;Left;10 reps   PT Diagnosis: Difficulty walking;Acute pain  PT Problem List: Decreased strength;Decreased range of motion;Decreased mobility;Decreased activity tolerance;Decreased knowledge of use of  DME;Pain PT Treatment Interventions: DME instruction;Gait training;Stair training;Functional mobility training;Therapeutic activities;Therapeutic exercise;Patient/family education   PT Goals Acute Rehab PT Goals PT Goal Formulation: With patient Time For Goal Achievement: 10/20/11 Potential to Achieve Goals: Good Pt will go Supine/Side to Sit: Independently PT Goal: Supine/Side to Sit - Progress: Goal set today Pt will go Sit to Supine/Side: Independently PT Goal: Sit to Supine/Side - Progress: Goal set today Pt will go Sit to Stand: with supervision PT Goal: Sit to Stand - Progress: Goal set today Pt will go Stand to Sit: with supervision PT Goal: Stand to Sit - Progress: Goal set today Pt will Ambulate: >150 feet;with supervision;with rolling walker PT Goal: Ambulate - Progress: Goal set today Pt will Go Up / Down Stairs: 1-2 stairs;with supervision;with rail(s) PT Goal: Up/Down Stairs - Progress: Goal set today  Visit Information  Last PT Received On: 10/20/11 Assistance Needed: +1    Subjective Data  Subjective: i am doing great. Patient Stated Goal:  to go home tomorrow   Prior Functioning  Home Living Lives With: Spouse Available Help at Discharge: Family;Available 24 hours/day Type of Home: House Home Access: Stairs to enter [employer] of Steps: 3 Entrance Stairs-Rails: Can reach both Home Layout: One level Home Adaptive Equipment: Walker - rolling Prior Function Level of Independence: Independent Able to Take Stairs?: Reciprically Vocation: Retired Musician: No difficulties    Cognition  Overall Cognitive Status: Appears within functional limits for tasks assessed/performed Arousal/Alertness: Awake/alert Orientation Level: Appears intact for tasks assessed Behavior During Session: [facility] for tasks performed    Extremity/Trunk Assessment Right Lower Extremity Assessment RLE ROM/Strength/Tone: Within functional levels Left Lower  Extremity Assessment LLE ROM/Strength/Tone: Deficits LLE ROM/Strength/Tone Deficits: able to perform SLR , knee flexion to 45 degrees. LLE Sensation: WFL -  Light Touch   Balance    End of Session PT - End of Session Equipment Utilized During Treatment: Left knee immobilizer Activity Tolerance: Patient tolerated treatment well Patient left: in chair;with call bell/phone within reach Nurse Communication: Mobility status   Rada Hay 10/20/2011, 1:28 PM  [phone]

## 2011-10-20 NOTE — Progress Notes (Signed)
Physical Therapy Treatment Patient Details Name: [name] [name] MRN: [id number] DOB: Mar 27, 1941 Today's Date: 10/20/2011 Time: 8295-6213 PT Time Calculation (min): 15 min  PT Assessment / Plan / Recommendation Comments on Treatment Session  Pt's sats decrease on RA to ambulate. pt did not want to use O2.  replaced O2 after ambulatiion. pt continues to have very little pain and can SLR on L. pt plans to DC tomorrow,    Follow Up Recommendations  Home health PT;Supervision/Assistance - 24 hour    Barriers to Discharge        Equipment Recommendations  3 in 1 bedside comode    Recommendations for Other Services OT consult  Frequency 7X/week   Plan Discharge plan remains appropriate;Frequency remains appropriate    Precautions / Restrictions Precautions Precautions: Knee Required Braces or Orthoses: Knee Immobilizer - Left Knee Immobilizer - Left: Discontinue once straight leg raise with < 10 degree lag   Pertinent Vitals/Pain 4/10 sats at 88 on RA after ambulation. Replaced on 2 l.    Mobility  Bed Mobility Bed Mobility: Sit to Supine Sit to Supine: 4: Min guard;HOB flat Details for Bed Mobility Assistance: pt is able to lift LLE onto bed Transfers Transfers: Sit to Stand;Stand to Sit Sit to Stand: 4: Min guard;From chair/3-in-1;With upper extremity assist Stand to Sit: 5: Supervision;To bed;To elevated surface;With upper extremity assist Details for Transfer Assistance: VC to place LLE forward to sit down. Ambulation/Gait Ambulation/Gait Assistance: 4: Min guard Ambulation Distance (Feet): 125 Feet Assistive device: Rolling walker Ambulation/Gait Assistance Details: VC for sequence, step length, breathing. Gait Pattern: Step-through pattern    Exercises    PT Diagnosis: Difficulty walking;Acute pain  PT Problem List: Decreased strength;Decreased range of motion;Decreased mobility;Decreased activity tolerance;Decreased knowledge of use of DME;Pain PT Treatment  Interventions: DME instruction;Gait training;Stair training;Functional mobility training;Therapeutic activities;Therapeutic exercise;Patient/family education   PT Goals Acute Rehab PT Goals PT Goal Formulation: With patient Time For Goal Achievement: 10/20/11 Potential to Achieve Goals: Good Pt will go Supine/Side to Sit: Independently PT Goal: Supine/Side to Sit - Progress: Goal set today Pt will go Sit to Supine/Side: Independently PT Goal: Sit to Supine/Side - Progress: Progressing toward goal Pt will go Sit to Stand: with supervision PT Goal: Sit to Stand - Progress: Progressing toward goal Pt will go Stand to Sit: with supervision PT Goal: Stand to Sit - Progress: Progressing toward goal Pt will Ambulate: >150 feet;with rolling walker;with supervision PT Goal: Ambulate - Progress: Progressing toward goal Pt will Go Up / Down Stairs: 1-2 stairs;with supervision;with rail(s) PT Goal: Up/Down Stairs - Progress: Goal set today  Visit Information  Last PT Received On: 10/20/11 Assistance Needed: +1    Subjective Data  Subjective: i am ready to walk Patient Stated Goal:  to go home tomorrow   Cognition  Overall Cognitive Status: Appears within functional limits for tasks assessed/performed Arousal/Alertness: Awake/alert Orientation Level: Appears intact for tasks assessed Behavior During Session: [facility] for tasks performed    Balance     End of Session PT - End of Session Equipment Utilized During Treatment:  (pt did not want KI) Activity Tolerance: Patient tolerated treatment well Patient left: in bed;with call bell/phone within reach;with family/visitor present Nurse Communication: Mobility status CPM Left Knee CPM Left Knee: Off    Rada Hay 10/20/2011, 4:12 PM [phone]

## 2011-10-20 NOTE — Progress Notes (Signed)
Subjective: 1 Day Post-Op Procedure(s) (LRB): TOTAL KNEE ARTHROPLASTY (Left) Patient reports pain as minimal.  He had a great night.  Main conplaint is the foley. Will take out this morning. Patient seen in rounds with Dr. Lequita Halt. Patient is well, and has had no acute complaints or problems We will start therapy today.  Plan is to go Home after hospital stay.  Objective: Vital signs in last 24 hours: Temp:  [97 F (36.1 C)-97.9 F (36.6 C)] 97.3 F (36.3 C) (05/21 0603) Pulse Rate:  [57-83] 71  (05/21 0603) Resp:  [9-21] 18  (05/21 0603) BP: (99-146)/(60-96) 126/71 mmHg (05/21 0603) SpO2:  [92 %-100 %] 99 % (05/21 0603) Weight:  [91.627 kg (202 lb)] 91.627 kg (202 lb) (05/20 1730)  Intake/Output from previous day:  Intake/Output Summary (Last 24 hours) at 10/20/11 0719 Last data filed at 10/20/11 0603  Gross per 24 hour  Intake   3680 ml  Output   1965 ml  Net   1715 ml    Intake/Output this shift: UOP 725 since MN  Labs:  Basename 10/20/11 0430  HGB 10.5*    Basename 10/20/11 0430  WBC 10.3  RBC 3.44*  HCT 31.4*  PLT 194    Basename 10/20/11 0430  NA 137  K 4.2  CL 102  CO2 27  BUN 10  CREATININE 0.75  GLUCOSE 173*  CALCIUM 8.3*   No results found for this basename: LABPT:2,INR:2 in the last 72 hours  EXAM General - Patient is Alert, Appropriate and Oriented Extremity - Neurovascular intact Sensation intact distally Dressing - dressing C/D/I Motor Function - intact, moving foot and toes well on exam.  Hemovac pulled without difficulty.  Past Medical History  Diagnosis Date  . COPD (chronic obstructive pulmonary disease)   . Shortness of breath     with exertion   . Arthritis     knees and back   . Depression     ptsd     Assessment/Plan: 1 Day Post-Op Procedure(s) (LRB): TOTAL KNEE ARTHROPLASTY (Left) Principal Problem:  *OA (osteoarthritis) of knee   Advance diet Up with therapy Plan for discharge tomorrow Discharge home with  home health  DVT Prophylaxis - Xarelto Weight-Bearing as tolerated to left leg No vaccines. D/C PCA Morphine, Change to IV push D/C O2 and Pulse OX and try on Room Air  Mechele Kittleson, Marlowe Sax 10/20/2011, 7:19 AM

## 2011-10-21 LAB — CBC
MCHC: 33.6 g/dL (ref 30.0–36.0)
Platelets: 221 10*3/uL (ref 150–400)
RDW: 12.7 % (ref 11.5–15.5)

## 2011-10-21 LAB — BASIC METABOLIC PANEL
GFR calc Af Amer: >90 mL/min (ref >90)
GFR calc non Af Amer: 86 mL/min — ABNORMAL LOW (ref >90)
Potassium: 3.6 mEq/L (ref 3.5–5.1)
Sodium: 135 mEq/L (ref 135–145)

## 2011-10-21 MED ORDER — FLUTICASONE-SALMETEROL 250-50 MCG/DOSE IN AEPB
1.0000 | INHALATION_SPRAY | Freq: Two times a day (BID) | RESPIRATORY_TRACT | Status: DC
  Administered 2011-10-21 – 2011-10-22 (×2): 1 via RESPIRATORY_TRACT
  Filled 2011-10-21: qty 14

## 2011-10-21 MED ORDER — POLYSACCHARIDE IRON COMPLEX 150 MG PO CAPS
150.0000 mg | ORAL_CAPSULE | Freq: Every day | ORAL | Status: DC
  Administered 2011-10-21 – 2011-10-22 (×2): 150 mg via ORAL
  Filled 2011-10-21 (×2): qty 1

## 2011-10-21 MED ORDER — ALBUTEROL SULFATE (5 MG/ML) 0.5% IN NEBU
2.5000 mg | INHALATION_SOLUTION | RESPIRATORY_TRACT | Status: DC | PRN
  Filled 2011-10-21: qty 0.5

## 2011-10-21 MED ORDER — ALBUTEROL SULFATE (5 MG/ML) 0.5% IN NEBU
2.5000 mg | INHALATION_SOLUTION | Freq: Four times a day (QID) | RESPIRATORY_TRACT | Status: DC
  Administered 2011-10-21 – 2011-10-22 (×3): 2.5 mg via RESPIRATORY_TRACT
  Filled 2011-10-21 (×2): qty 0.5

## 2011-10-21 NOTE — Progress Notes (Signed)
Subjective: 2 Days Post-Op Procedure(s) (LRB): TOTAL KNEE ARTHROPLASTY (Left) Patient reports pain as moderate.   Patient seen in rounds with Dr. Lequita Halt. Patient is well, but has had some minor complaints of pain in the knee and leg, requiring pain medications.  He did very well with therapy yesterday which probably accounts for the increased pain today. Plan is to go Home after hospital stay.  Objective: Vital signs in last 24 hours: Temp:  [97.9 F (36.6 C)-98.4 F (36.9 C)] 98.4 F (36.9 C) (05/22 1400) Pulse Rate:  [78-145] 145  (05/22 1522) Resp:  [16-18] 18  (05/22 1400) BP: (128-150)/(71-87) 139/87 mmHg (05/22 1400) SpO2:  [80 %-93 %] 93 % (05/22 1559)  Intake/Output from previous day:  Intake/Output Summary (Last 24 hours) at 10/21/11 1931 Last data filed at 10/21/11 1900  Gross per 24 hour  Intake    662 ml  Output   1225 ml  Net   -563 ml    Intake/Output this shift:    Labs:  Basename 10/21/11 0410 10/20/11 0430  HGB 9.3* 10.5*    Basename 10/21/11 0410 10/20/11 0430  WBC 13.5* 10.3  RBC 3.07* 3.44*  HCT 27.7* 31.4*  PLT 221 194    Basename 10/21/11 0410 10/20/11 0430  NA 135 137  K 3.6 4.2  CL 99 102  CO2 27 27  BUN 12 10  CREATININE 0.86 0.75  GLUCOSE 120* 173*  CALCIUM 8.5 8.3*   No results found for this basename: LABPT:2,INR:2 in the last 72 hours  EXAM General - Patient is Alert, Appropriate and Oriented Extremity - Neurovascular intact Sensation intact distally Dressing/Incision - clean, dry, no drainage, healing Motor Function - intact, moving foot and toes well on exam.   Past Medical History  Diagnosis Date  . COPD (chronic obstructive pulmonary disease)   . Shortness of breath     with exertion   . Arthritis     knees and back   . Depression     ptsd     Assessment/Plan: 2 Days Post-Op Procedure(s) (LRB): TOTAL KNEE ARTHROPLASTY (Left) Principal Problem:  *OA (osteoarthritis) of knee   Up with therapy Plan for  discharge tomorrow Discharge home with home health  DVT Prophylaxis - Xarelto Weight-Bearing as tolerated to left leg  Mariesa Grieder 10/21/2011, 7:31 PM

## 2011-10-21 NOTE — Progress Notes (Signed)
Physical Therapy Treatment Patient Details Name: [name] [name] MRN: [id number] DOB: [birth year] Today's Date: 10/21/2011 Time: 4098-1191 PT Time Calculation (min): 25 min  PT Assessment / Plan / Recommendation Comments on Treatment Session  Pt c/o terrible pain  overnight with pain in thigh more so. applied Heat to thigh. pt rested and now got up this PM and walked.  pt did walk a short distance but was limited due to dyspnea, HR tio 145  sats to 80 on RA.. replaced O2.    Follow Up Recommendations  Home health PT    Barriers to Discharge        Equipment Recommendations       Recommendations for Other Services    Frequency 7X/week   Plan Discharge plan remains appropriate;Frequency remains appropriate    Precautions / Restrictions Precautions Precautions: Knee   Pertinent Vitals/Pain 4/10 Pain; HR 145 max, sats 80 RA. RN notified. Replaced 2 l O2 saTS TO 93 hR TO 114    Mobility  Bed Mobility Supine to Sit: 4: Min assist;HOB flat;With rails Details for Bed Mobility Assistance: support LLE to floor Transfers Transfers: Sit to Stand;Stand to Sit Sit to Stand: 4: Min assist;From bed;From elevated surface Stand to Sit: 4: Min assist;To chair/3-in-1;With upper extremity assist Details for Transfer Assistance: vc for hand placement and LLE placement. Ambulation/Gait Ambulation Distance (Feet): 50 Feet Assistive device: Rolling walker Ambulation/Gait Assistance Details: ambulation limited due to increased HR and decreased sats. Gait Pattern: Step-through pattern Gait velocity: slow and halting  due to dyspnea.    Exercises Total Joint Exercises Ankle Circles/Pumps: AROM;Both;10 reps Quad Sets: AROM;10 reps;Left Heel Slides: AAROM;Left;10 reps;Supine Straight Leg Raises: Left;10 reps;Supine;AAROM   PT Diagnosis:    PT Problem List:   PT Treatment Interventions:     PT Goals Acute Rehab PT Goals Pt will go Supine/Side to Sit: Independently PT Goal: Supine/Side to  Sit - Progress: Progressing toward goal Pt will go Sit to Stand: with supervision PT Goal: Sit to Stand - Progress: Progressing toward goal Pt will go Stand to Sit: with supervision PT Goal: Stand to Sit - Progress: Progressing toward goal Pt will Ambulate: >150 feet;with rolling walker PT Goal: Ambulate - Progress: Progressing toward goal  Visit Information  Last PT Received On: 10/21/11 Assistance Needed: +2 (for O2 monitor.)    Subjective Data  Subjective: I don't wamt the O2. My HR is high because of my COPD   Cognition  Overall Cognitive Status: Appears within functional limits for tasks assessed/performed    Balance     End of Session PT - End of Session Equipment Utilized During Treatment: Left knee immobilizer Activity Tolerance: Treatment limited secondary to medical complications (Comment) Patient left: in chair;with call bell/phone within reach Nurse Communication: Mobility status (decreased sats. increased HR)    Rada Hay 10/21/2011, 3:32 PM [phone]

## 2011-10-22 LAB — PREPARE RBC (CROSSMATCH)

## 2011-10-22 LAB — CBC
Hemoglobin: 8.1 g/dL — ABNORMAL LOW (ref 13.0–17.0)
MCHC: 33.3 g/dL (ref 30.0–36.0)
RBC: 2.7 MIL/uL — ABNORMAL LOW (ref 4.22–5.81)
WBC: 12.3 10*3/uL — ABNORMAL HIGH (ref 4.0–10.5)

## 2011-10-22 MED ORDER — MENTHOL 3 MG MT LOZG: 1.0000 | LOZENGE | OROMUCOSAL | Status: DC | PRN

## 2011-10-22 MED ORDER — ALUM & MAG HYDROXIDE-SIMETH 200-200-20 MG/5ML PO SUSP
30.0000 mL | ORAL | Status: DC | PRN
  Administered 2011-10-22: 30 mL via ORAL
  Filled 2011-10-22: qty 30

## 2011-10-22 MED ORDER — OXYCODONE HCL 5 MG PO TABS: 5.0000 mg | ORAL_TABLET | ORAL | Status: AC | PRN

## 2011-10-22 MED ORDER — METHOCARBAMOL 500 MG PO TABS: 500.0000 mg | ORAL_TABLET | Freq: Four times a day (QID) | ORAL | Status: AC | PRN

## 2011-10-22 MED ORDER — ACETAMINOPHEN 325 MG PO TABS
650.0000 mg | ORAL_TABLET | Freq: Once | ORAL | Status: AC
  Administered 2011-10-22: 650 mg via ORAL
  Filled 2011-10-22: qty 2

## 2011-10-22 MED ORDER — RIVAROXABAN 10 MG PO TABS: 10.0000 mg | ORAL_TABLET | Freq: Every day | ORAL | Status: DC

## 2011-10-22 MED ORDER — POLYSACCHARIDE IRON COMPLEX 150 MG PO CAPS: 150.0000 mg | ORAL_CAPSULE | Freq: Every day | ORAL | Status: DC

## 2011-10-22 NOTE — Progress Notes (Signed)
Subjective: 3 Days Post-Op Procedure(s) (LRB): TOTAL KNEE ARTHROPLASTY (Left) Patient reports pain as mild.  He is tired but feeling OK.  HGB down to 8.1.  Discussed with Dr. Lequita Halt and will give two units and then allow home later today after blood and therapy. Patient seen in rounds with Dr. Lequita Halt. Patient is well, but has had some minor complaints of fatigue Patient is ready to go home after blood and therapy.  Objective: Vital signs in last 24 hours: Temp:  [98.4 F (36.9 C)-99.1 F (37.3 C)] 98.9 F (37.2 C) (05/23 0652) Pulse Rate:  [112-145] 113  (05/23 0652) Resp:  [18] 18  (05/23 0652) BP: (139-149)/(79-87) 149/79 mmHg (05/23 0652) SpO2:  [80 %-94 %] 93 % (05/23 0834)  Intake/Output from previous day:  Intake/Output Summary (Last 24 hours) at 10/22/11 0945 Last data filed at 10/22/11 1610  Gross per 24 hour  Intake   1000 ml  Output   1125 ml  Net   -125 ml    Intake/Output this shift: Total I/O In: 480 [P.O.:480] Out: -   Labs:  Basename 10/22/11 0432 10/21/11 0410 10/20/11 0430  HGB 8.1* 9.3* 10.5*    Basename 10/22/11 0432 10/21/11 0410  WBC 12.3* 13.5*  RBC 2.70* 3.07*  HCT 24.3* 27.7*  PLT 204 221    Basename 10/21/11 0410 10/20/11 0430  NA 135 137  K 3.6 4.2  CL 99 102  CO2 27 27  BUN 12 10  CREATININE 0.86 0.75  GLUCOSE 120* 173*  CALCIUM 8.5 8.3*   No results found for this basename: LABPT:2,INR:2 in the last 72 hours  EXAM: General - Patient is Alert, Appropriate and Oriented Extremity - Neurovascular intact Sensation intact distally Incision - clean, dry, no drainage, healing Motor Function - intact, moving foot and toes well on exam.   Assessment/Plan: 3 Days Post-Op Procedure(s) (LRB): TOTAL KNEE ARTHROPLASTY (Left) Procedure(s) (LRB): TOTAL KNEE ARTHROPLASTY (Left) Past Medical History  Diagnosis Date  . COPD (chronic obstructive pulmonary disease)   . Shortness of breath     with exertion   . Arthritis     knees and  back   . Depression     ptsd    Principal Problem:  *OA (osteoarthritis) of knee   Discharge home with home health after blood and therapy. Diet - Regular diet Follow up - in 2 weeks Activity - WBAT Disposition - Home Condition Upon Discharge - Good D/C Meds - See DC Summary DVT Prophylaxis - Xarelto  Verneda Hollopeter 10/22/2011, 9:45 AM

## 2011-10-22 NOTE — Progress Notes (Signed)
Physical Therapy Treatment Patient Details Name: [name] [name] MRN: [id number] DOB: Nov 03, 1940 Today's Date: 10/22/2011 Time: 4098-1191 PT Time Calculation (min): 20 min  PT Assessment / Plan / Recommendation Comments on Treatment Session  Pt stated he slept much better last night.  performed TKR TE's in bed while pt receiving blood then applied CPM/ICE.    Follow Up Recommendations  Home health PT    Barriers to Discharge        Equipment Recommendations       Recommendations for Other Services    Frequency 7X/week   Plan Discharge plan remains appropriate    Precautions / Restrictions   KI for amb until able to perform 10 active SLR  Pertinent Vitals/Pain C/o 7/10 L knee pain applied ICE    Mobility    Tx session focused on TKR TE's  Pt given handout on TKR TE's HEP  Exercises Total Joint Exercises Ankle Circles/Pumps: AROM;Both;10 reps;Supine Quad Sets: AROM;Both;10 reps;Supine Gluteal Sets: AROM;Both;10 reps;Supine Towel Squeeze: AROM;Both;10 reps;Supine Short Arc Quad: AAROM;Left;10 reps;Supine Heel Slides: AAROM;Left;10 reps;Supine Hip ABduction/ADduction: AAROM;Left;10 reps;Supine Straight Leg Raises: AAROM;Left;10 reps;Supine    PT Goals Acute Rehab PT Goals PT Goal Formulation: With patient Potential to Achieve Goals: Good  Visit Information  Last PT Received On: 10/22/11 Assistance Needed: +2    Subjective Data  Subjective: I slept very well last night Patient Stated Goal: go home   Cognition    good   Balance   NT  End of Session PT - End of Session Equipment Utilized During Treatment: Oxygen;Other (comment) (2 lts) Activity Tolerance: Patient tolerated treatment well Patient left: in bed;with call bell/phone within reach;in CPM Nurse Communication: Other (comment) (requests for pain meds) CPM Left Knee CPM Left Knee: On Left Knee Flexion (Degrees): 50  Left Knee Extension (Degrees): 10  Additional Comments: time on 11am while pt  receiving first unit blood    Felecia Shelling  PTA [facility]  Acute  Rehab Pager     [phone]

## 2011-10-22 NOTE — Progress Notes (Signed)
Patient sitting in bed with wife at bedside. He is completing his last blood transfusion and has been tolerating this very well. Discharge instructions and education given with read back and they voice clear understanding. Patient will discharge home after completion of blood transfusion.

## 2011-10-22 NOTE — Discharge Summary (Signed)
Physician Discharge Summary   Patient ID: [name] [name] MRN: [id number] DOB/AGE: 71-Dec-1942 71 y.o.  Admit date: 10/19/2011 Discharge date: 10/22/2011  Primary Diagnosis: Osteoarthritis Left Knee    Admission Diagnoses:  Past Medical History  Diagnosis Date  . COPD (chronic obstructive pulmonary disease)   . Shortness of breath     with exertion   . Arthritis     knees and back   . Depression     ptsd    Discharge Diagnoses:   Principal Problem:  *OA (osteoarthritis) of knee  Procedure:  Procedure(s) (LRB): TOTAL KNEE ARTHROPLASTY (Left)   Consults: None  HPI: [name] [name] is a 71 y.o. year old male with end stage OA of his right knee with progressively worsening pain and dysfunction. He has constant pain, with activity and at rest and significant functional deficits with difficulties even with ADLs. He has had extensive non-op management including analgesics, injections of cortisone , and home exercise program, but remains in significant pain with significant dysfunction. Radiographs show bone on bone arthritis medial and patellofemoral with large medial osteophytes.He presents now for right Total Knee Arthroplasty.       Laboratory Data: Hospital Outpatient Visit on 10/12/2011  Component Date Value Range Status  . aPTT (seconds) 10/12/2011 39* 24-37 Final   Comment:                                 IF BASELINE aPTT IS ELEVATED,                          SUGGEST PATIENT RISK ASSESSMENT                          BE USED TO DETERMINE APPROPRIATE                          ANTICOAGULANT THERAPY.  . WBC (K/uL) 10/12/2011 7.2  4.0-10.5 Final  . RBC (MIL/uL) 10/12/2011 4.95  4.22-5.81 Final  . Hemoglobin (g/dL) 29/56/2130 86.5  78.4-69.6 Final  . HCT (%) 10/12/2011 45.5  39.0-52.0 Final  . MCV (fL) 10/12/2011 91.9  78.0-100.0 Final  . MCH (pg) 10/12/2011 30.1  26.0-34.0 Final  . MCHC (g/dL) 29/52/8413 24.4  01.0-27.2 Final  . RDW (%) 10/12/2011 13.4  11.5-15.5 Final    . Platelets (K/uL) 10/12/2011 245  150-400 Final  . Sodium (mEq/L) 10/12/2011 141  135-145 Final  . Potassium (mEq/L) 10/12/2011 4.9  3.5-5.1 Final  . Chloride (mEq/L) 10/12/2011 104  96-112 Final  . CO2 (mEq/L) 10/12/2011 28  19-32 Final  . Glucose, Bld (mg/dL) 53/66/4403 98  47-42 Final  . BUN (mg/dL) 59/56/3875 17  6-43 Final  . Creatinine, Ser (mg/dL) 32/95/1884 1.66  0.63-0.16 Final  . Calcium (mg/dL) 06/09/3233 9.4  5.7-32.2 Final  . Total Protein (g/dL) 02/54/2706 7.4  2.3-7.6 Final  . Albumin (g/dL) 28/31/5176 4.3  1.6-0.7 Final  . AST (U/L) 10/12/2011 15  0-37 Final  . ALT (U/L) 10/12/2011 12  0-53 Final  . Alkaline Phosphatase (U/L) 10/12/2011 72  39-117 Final  . Total Bilirubin (mg/dL) 37/03/6268 0.4  4.8-5.4 Final  . GFR calc non Af Amer (mL/min) 10/12/2011 86* >90 Final  . GFR calc Af Amer (mL/min) 10/12/2011 >90  >90 Final   Comment:  The eGFR has been calculated                          using the CKD EPI equation.                          This calculation has not been                          validated in all clinical                          situations.                          eGFR's persistently                          <90 mL/min signify                          possible Chronic Kidney Disease.  [name] Prothrombin Time (seconds) 10/12/2011 12.9  11.6-15.2 Final  . INR  10/12/2011 0.95  0.00-1.49 Final  . Color, Urine  10/12/2011 YELLOW  YELLOW Final  . APPearance  10/12/2011 CLEAR  CLEAR Final  . Specific Gravity, Urine  10/12/2011 1.024  1.005-1.030 Final  . pH  10/12/2011 6.0  5.0-8.0 Final  . Glucose, UA (mg/dL) 14/78/2956 NEGATIVE  NEGATIVE Final  . Hgb urine dipstick  10/12/2011 NEGATIVE  NEGATIVE Final  . Bilirubin Urine  10/12/2011 NEGATIVE  NEGATIVE Final  . Ketones, ur (mg/dL) 21/30/8657 NEGATIVE  NEGATIVE Final  . Protein, ur (mg/dL) 84/69/6295 NEGATIVE  NEGATIVE Final  . Urobilinogen, UA (mg/dL) 28/41/3244 0.2  0.1-0.2  Final  . Nitrite  10/12/2011 NEGATIVE  NEGATIVE Final  . Leukocytes, UA  10/12/2011 NEGATIVE  NEGATIVE Final   MICROSCOPIC NOT DONE ON URINES WITH NEGATIVE PROTEIN, BLOOD, LEUKOCYTES, NITRITE, OR GLUCOSE <1000 mg/dL.  [name] MRSA, PCR  10/12/2011 NEGATIVE  NEGATIVE Final  . Staphylococcus aureus  10/12/2011 NEGATIVE  NEGATIVE Final   Comment:                                 The Xpert SA Assay (FDA                          approved for NASAL specimens                          only), is one component of                          a comprehensive surveillance                          program.  It is not intended                          to diagnose infection nor to                          guide or monitor treatment.  Basename 10/22/11 0432 10/21/11 0410 10/20/11 0430  HGB 8.1* 9.3* 10.5*    Basename 10/22/11 0432 10/21/11 0410  WBC 12.3* 13.5*  RBC 2.70* 3.07*  HCT 24.3* 27.7*  PLT 204 221    Basename 10/21/11 0410 10/20/11 0430  NA 135 137  K 3.6 4.2  CL 99 102  CO2 27 27  BUN 12 10  CREATININE 0.86 0.75  GLUCOSE 120* 173*  CALCIUM 8.5 8.3*   No results found for this basename: LABPT:2,INR:2 in the last 72 hours  X-Rays:No results found.  EKG: Orders placed during the hospital encounter of 03/12/11  . EKG     Hospital Course: Patient was admitted to [facility] and taken to the OR and underwent the above state procedure without complications.  Patient tolerated the procedure well and was later transferred to the recovery room and then to the orthopaedic floor for postoperative care.  They were given PO and IV analgesics for pain control following their surgery.  They were given 24 hours of postoperative antibiotics and started on DVT prophylaxis in the form of Xarelto.   PT and OT were ordered for total joint protocol.  Discharge planning consulted to help with postop disposition and equipment needs.  Patient had a great night on the evening of surgery and started to  get up OOB with therapy on day one.  Main conplaint is the foley. Took out that morning.  PCA Morphine was discontinued and they were weaned over to PO meds.  Hemovac drain was pulled without difficulty.  Continued to work with therapy into day two despite increase pain from the amount of therapy the day before.  Dressing was changed on day two and the incision was healing well.  By day three, the patient had progressed with therapy and meeting their goals.  Incision was healing well.  Patient was seen in rounds and was ready to go home.  Discharge Medications: Prior to Admission medications   Medication Sig Start Date End Date Taking? Authorizing Provider  albuterol (PROVENTIL) (2.5 MG/3ML) 0.083% nebulizer solution Take 2.5 mg by nebulization every 6 (six) hours as needed. For shortness of breath   Yes Historical Provider, MD  Fexofenadine-Pseudoephedrine (ALLEGRA-D 12 HOUR PO) Take 1 tablet by mouth every 12 (twelve) hours.   Yes Historical Provider, MD  fluticasone-salmeterol (ADVAIR HFA) 115-21 MCG/ACT inhaler Inhale 2 puffs into the lungs 2 (two) times daily.   Yes Historical Provider, MD  PARoxetine (PAXIL) 20 MG tablet Take 20 mg by mouth daily with breakfast.   Yes Historical Provider, MD  tiotropium (SPIRIVA) 18 MCG inhalation capsule Place 18 mcg into inhaler and inhale daily.   Yes Historical Provider, MD  iron polysaccharides (NIFEREX) 150 MG capsule Take 1 capsule (150 mg total) by mouth daily. 10/22/11 10/21/12  Xayne Brumbaugh, PA  methocarbamol (ROBAXIN) 500 MG tablet Take 1 tablet (500 mg total) by mouth every 6 (six) hours as needed. 10/22/11 11/01/11  Zaccary Creech, PA  oxyCODONE (OXY IR/ROXICODONE) 5 MG immediate release tablet Take 1-2 tablets (5-10 mg total) by mouth every 3 (three) hours as needed. 10/22/11 11/01/11  Westyn Keatley Julien Girt, PA  rivaroxaban (XARELTO) 10 MG TABS tablet Take 1 tablet (10 mg total) by mouth daily with breakfast. 10/22/11   Tahari Clabaugh Julien Girt, PA     Diet: Regular diet Activity:WBAT Follow-up:in 2 weeks Disposition - Home Discharged Condition: good   Discharge Orders    Future Orders Please Complete By Expires   Diet general  Call MD / Call 911      Comments:   If you experience chest pain or shortness of breath, CALL 911 and be transported to the hospital emergency room.  If you develope a fever above 101 F, pus (white drainage) or increased drainage or redness at the wound, or calf pain, call your surgeon's office.   Discharge instructions      Comments:   Pick up stool softner and laxative for home. Do not submerge incision under water. May shower. Continue to use ice for pain and swelling from surgery.  Take Xarelto for two and a half more weeks, then discontinue Xarelto.   Constipation Prevention      Comments:   Drink plenty of fluids.  Prune juice may be helpful.  You may use a stool softener, such as Colace (over the counter) 100 mg twice a day.  Use MiraLax (over the counter) for constipation as needed.   Increase activity slowly as tolerated      Patient may shower      Comments:   You may shower without a dressing once there is no drainage.  Do not wash over the wound.  If drainage remains, do not shower until drainage stops.   Driving restrictions      Comments:   No driving until released by the physician.   Lifting restrictions      Comments:   No lifting until released by the physician.   TED hose      Comments:   Use stockings (TED hose) for 3 weeks on both leg(s).  You may remove them at night for sleeping.   Change dressing      Comments:   Change dressing daily with sterile 4 x 4 inch gauze dressing and apply TED hose. Do not submerge the incision under water.   Do not put a pillow under the knee. Place it under the heel.      Do not sit on low chairs, stoools or toilet seats, as it may be difficult to get up from low surfaces        Medication List  As of 10/22/2011  9:53 AM   STOP taking  these medications         diclofenac 75 MG EC tablet      HYDROcodone-acetaminophen 5-500 MG per tablet         TAKE these medications         albuterol (2.5 MG/3ML) 0.083% nebulizer solution   Commonly known as: PROVENTIL   Take 2.5 mg by nebulization every 6 (six) hours as needed. For shortness of breath      ALLEGRA-D 12 HOUR PO   Take 1 tablet by mouth every 12 (twelve) hours.      fluticasone-salmeterol 115-21 MCG/ACT inhaler   Commonly known as: ADVAIR HFA   Inhale 2 puffs into the lungs 2 (two) times daily.      iron polysaccharides 150 MG capsule   Commonly known as: NIFEREX   Take 1 capsule (150 mg total) by mouth daily.      methocarbamol 500 MG tablet   Commonly known as: ROBAXIN   Take 1 tablet (500 mg total) by mouth every 6 (six) hours as needed.      oxyCODONE 5 MG immediate release tablet   Commonly known as: Oxy IR/ROXICODONE   Take 1-2 tablets (5-10 mg total) by mouth every 3 (three) hours as needed.      PARoxetine 20 MG tablet  Commonly known as: PAXIL   Take 20 mg by mouth daily with breakfast.      rivaroxaban 10 MG Tabs tablet   Commonly known as: XARELTO   Take 1 tablet (10 mg total) by mouth daily with breakfast.      tiotropium 18 MCG inhalation capsule   Commonly known as: SPIRIVA   Place 18 mcg into inhaler and inhale daily.           Follow-up Information    Follow up with Loanne Drilling, MD. Schedule an appointment as soon as possible for a visit in 2 weeks.   Contact information:   [facility] [address], [address] [place] [address] [phone]          Signed: Patrica Duel 10/22/2011, 9:53 AM

## 2011-10-22 NOTE — Discharge Instructions (Signed)
 Dr. Frank Aluisio Total Joint Specialist [facility] [address] [place], [place] [address] [phone]  TOTAL KNEE REPLACEMENT POSTOPERATIVE DIRECTIONS    Knee Rehabilitation, Guidelines Following Surgery  Results after knee surgery are often greatly improved when you follow the exercise, range of motion and muscle strengthening exercises prescribed by your doctor. Safety measures are also important to protect the knee from further injury. Any time any of these exercises cause you to have increased pain or swelling in your knee joint, decrease the amount until you are comfortable again and slowly increase them. If you have problems or questions, call your caregiver or physical therapist for advice.   HOME CARE INSTRUCTIONS  Remove items at home which could result in a fall. This includes throw rugs or furniture in walking pathways.  Continue medications as instructed at time of discharge. You may have some home medications which will be placed on hold until you complete the course of blood thinner medication.  You may start showering once you are discharged home but do not submerge the incision under water.  Walk with walker as instructed.   Use walker as long as suggested by your caregivers.  Avoid periods of inactivity such as sitting longer than an hour when not asleep. This helps prevent blood clots.  You may put full weight on your legs and walk as much as is comfortable.  You may resume a sexual relationship in one month or when given the OK by your doctor.  You may return to work once you are cleared by your doctor.  Do not drive a car for 6 weeks or until released by you surgeon.   Do not drive while taking narcotics.  Wear the elastic stockings for three weeks following surgery during the day but you may remove then at night. Make sure you keep all of your appointments after your operation with all of your doctors and caregivers. You should call  the office at the above phone number and make an appointment for approximately two weeks after the date of your surgery. Change the dressing daily and reapply a dry dressing each time. Please pick up a stool softener and laxative for home use as long as you are requiring pain medications. Continue to use ice on the knee for pain and swelling from surgery. It is important for you to complete the blood thinner medication as prescribed by your doctor.  RANGE OF MOTION AND STRENGTHENING EXERCISES  Rehabilitation of the knee is important following a knee injury or an operation. After just a few days of immobilization, the muscles of the thigh which control the knee become weakened and shrink (atrophy). Knee exercises are designed to build up the tone and strength of the thigh muscles and to improve knee motion. Often times heat used for twenty to thirty minutes before working out will loosen up your tissues and help with improving the range of motion but do not use heat for the first two weeks following surgery. These exercises can be done on a training (exercise) mat, on the floor, on a table or on a bed. Use what ever works the best and is most comfortable for you Knee exercises include:  Leg Lifts - While your knee is still immobilized in a splint or cast, you can do straight leg raises. Lift the leg to 60 degrees, hold for 3 sec, and slowly lower the leg. Repeat 10-20 times 2-3 times daily. Perform this exercise against resistance later as your   knee gets better.  Quad and Hamstring Sets - Tighten up the muscle on the front of the thigh (Quad) and hold for 5-10 sec. Repeat this 10-20 times hourly. Hamstring sets are done by pushing the foot backward against an object and holding for 5-10 sec. Repeat as with quad sets.  A rehabilitation program following serious knee injuries can speed recovery and prevent re-injury in the future due to weakened muscles. Contact your doctor or a physical therapist for more  information on knee rehabilitation.   SKILLED REHAB INSTRUCTIONS: If the patient is transferred to a skilled rehab facility following release from the hospital, a list of the current medications will be sent to the facility for the patient to continue.  When discharged from the skilled rehab facility, please have the facility set up the patient's Home Health Physical Therapy prior to being released. Also, the skilled facility will be responsible for providing the patient with their medications at time of release from the facility to include their pain medication, the muscle relaxants, and their blood thinner medication. If the patient is still at the rehab facility at time of the two week follow up appointment, the skilled rehab facility will also need to assist the patient in arranging follow up appointment in our office and any transportation needs.  MAKE SURE YOU:  Understand these instructions.  Will watch your condition.  Will get help right away if you are not doing well or get worse.  Document Released: 05/18/2005 Document Revised: 05/07/2011 Document Reviewed: 11/05/2006  ExitCare Patient Information 2012 [employer].    Take Xarelto for two and a half more weeks, then discontinue Xarelto.       

## 2011-10-22 NOTE — Progress Notes (Signed)
Physical Therapy Treatment Patient Details Name: [name] [name] MRN: [id number] DOB: 03-20-1941 Today's Date: 10/22/2011 Time: 1440-1510 PT Time Calculation (min): 30 min  Tx session focused on stair training with pt and spouse  Pertinent Vitals/Pain C/o 7/1o, ICE applied    Mobility  Bed Mobility Bed Mobility: Supine to Sit Supine to Sit: 4: Min guard Details for Bed Mobility Assistance: support for L LE off bed and increased time Transfers Transfers: Sit to Stand;Stand to Sit Sit to Stand: 4: Min guard;From bed;From elevated surface Stand to Sit: 4: Min guard;To bed Details for Transfer Assistance: 50% VC's on safety, trun completion as pt is impulsive Ambulation/Gait Ambulation/Gait Assistance: 4: Min guard Ambulation Distance (Feet): 175 Feet Assistive device: Rolling walker Ambulation/Gait Assistance Details: Pt demonstrates limited activity tolerance 2nd COPD. 50% VC's to decrease gait speed to increase safety/fall risk Gait Pattern: Step-to pattern;Decreased stance time - left Stairs: Yes Stairs Assistance: 4: Min guard;Other (comment) (with spouse present for education/instruction) Stair Management Technique: Two rails Number of Stairs: 4  Wheelchair Mobility Wheelchair Mobility: No        Visit Information  Last PT Received On: 10/22/11 Assistance Needed: +1    Pt plans to D/C to home after 2nd unit blood           Felecia Shelling  PTA [facility]  Acute  Rehab Pager     [phone]

## 2011-10-23 LAB — TYPE AND SCREEN
Unit division: 0
Unit division: 0
Unit division: 0

## 2012-02-12 ENCOUNTER — Encounter (HOSPITAL_COMMUNITY): Payer: Self-pay | Admitting: Pharmacy Technician

## 2012-02-15 ENCOUNTER — Encounter (HOSPITAL_COMMUNITY): Payer: Self-pay

## 2012-02-15 ENCOUNTER — Encounter (HOSPITAL_COMMUNITY)
Admission: RE | Admit: 2012-02-15 | Discharge: 2012-02-15 | Disposition: A | Discharge status: Home / Self Care | Payer: Medicare Other | Source: Ambulatory Visit | Attending: Orthopedic Surgery | Admitting: Orthopedic Surgery

## 2012-02-15 HISTORY — DX: Pneumonia, unspecified organism: J18.9

## 2012-02-15 LAB — COMPREHENSIVE METABOLIC PANEL
ALT: 11 U/L (ref 0–53)
Albumin: 4.1 g/dL (ref 3.5–5.2)
Alkaline Phosphatase: 73 U/L (ref 39–117)
Calcium: 9.2 mg/dL (ref 8.4–10.5)
GFR calc Af Amer: >90 mL/min (ref >90)
Glucose, Bld: 97 mg/dL (ref 70–99)
Potassium: 4.4 mEq/L (ref 3.5–5.1)
Sodium: 137 mEq/L (ref 135–145)
Total Protein: 7 g/dL (ref 6.0–8.3)

## 2012-02-15 LAB — CBC WITH DIFFERENTIAL/PLATELET
Eosinophils Relative: 2 % (ref 0–5)
HCT: 43.2 % (ref 39.0–52.0)
Lymphocytes Relative: 28 % (ref 12–46)
Lymphs Abs: 2 10*3/uL (ref 0.7–4.0)
MCH: 30 pg (ref 26.0–34.0)
MCV: 88.2 fL (ref 78.0–100.0)
Monocytes Absolute: 0.5 10*3/uL (ref 0.1–1.0)
RBC: 4.9 MIL/uL (ref 4.22–5.81)
WBC: 7.4 10*3/uL (ref 4.0–10.5)

## 2012-02-15 LAB — URINALYSIS, ROUTINE W REFLEX MICROSCOPIC
Glucose, UA: NEGATIVE mg/dL
Hgb urine dipstick: NEGATIVE
Leukocytes, UA: NEGATIVE
Protein, ur: NEGATIVE mg/dL
Specific Gravity, Urine: 1.024 (ref 1.005–1.030)
Urobilinogen, UA: 0.2 mg/dL (ref 0.0–1.0)

## 2012-02-15 LAB — SURGICAL PCR SCREEN
MRSA, PCR: NEGATIVE
Staphylococcus aureus: NEGATIVE

## 2012-02-15 LAB — PROTIME-INR: Prothrombin Time: 12.6 seconds (ref 11.6–15.2)

## 2012-02-15 MED ORDER — CEFAZOLIN SODIUM-DEXTROSE 2-3 GM-% IV SOLR
2.0000 g | INTRAVENOUS | Status: AC
  Administered 2012-02-16: 2 g via INTRAVENOUS
  Filled 2012-02-15: qty 50

## 2012-02-15 NOTE — Pre-Procedure Instructions (Signed)
[address]  02/15/2012   Your procedure is scheduled [id number]  Tuesday March 17, 2012   Report to [facility] at 0530 AM.  Call this number if you have problems the morning of surgery: [phone]   Remember:   Do not eat food or drink:After Midnight.      Take these medicines the morning of surgery with A SIP OF WATER: albuterol   paxil   advair  spiriva   Do not wear jewelry, make-up or nail polish.  Do not wear lotions, powders, or perfumes. You may wear deodorant.  Do not shave 48 hours prior to surgery. Men may shave face and neck.  Do not bring valuables to the hospital.  Contacts, dentures or bridgework may not be worn into surgery.  Leave suitcase in the car. After surgery it may be brought to your room.  For patients admitted to the hospital, checkout time is 11:00 AM the day of discharge.   Patients discharged the day of surgery will not be allowed to drive home.  Name and phone number of your driver: [place] [employer]-  [phone]  Special Instructions: CHG Shower Use Special Wash: 1/2 bottle night before surgery and 1/2 bottle morning of surgery.   Please read over the following fact sheets that you were given: Pain Booklet, Coughing and Deep Breathing, Lab Information and MRSA Information

## 2012-02-16 ENCOUNTER — Encounter (HOSPITAL_COMMUNITY): Payer: Self-pay | Admitting: Anesthesiology

## 2012-02-16 ENCOUNTER — Encounter (HOSPITAL_COMMUNITY)
Admission: RE | Disposition: A | Discharge status: Home / Self Care | Payer: Self-pay | Source: Ambulatory Visit | Attending: Orthopedic Surgery

## 2012-02-16 ENCOUNTER — Ambulatory Visit (HOSPITAL_COMMUNITY): Payer: Medicare Other

## 2012-02-16 ENCOUNTER — Encounter (HOSPITAL_COMMUNITY): Payer: Self-pay | Admitting: *Deleted

## 2012-02-16 ENCOUNTER — Ambulatory Visit (HOSPITAL_COMMUNITY): Payer: Medicare Other | Admitting: Anesthesiology

## 2012-02-16 ENCOUNTER — Inpatient Hospital Stay (HOSPITAL_COMMUNITY)
Admission: RE | Admit: 2012-02-16 | Discharge: 2012-02-18 | DRG: 496 | Disposition: A | Discharge status: Home / Self Care | Payer: Medicare Other | Source: Ambulatory Visit | Attending: Orthopedic Surgery | Admitting: Orthopedic Surgery

## 2012-02-16 DIAGNOSIS — Z01812 Encounter for preprocedural laboratory examination: Secondary | ICD-10-CM

## 2012-02-16 DIAGNOSIS — Z87891 Personal history of nicotine dependence: Secondary | ICD-10-CM

## 2012-02-16 DIAGNOSIS — F32A Depression, unspecified: Secondary | ICD-10-CM

## 2012-02-16 DIAGNOSIS — Z981 Arthrodesis status: Secondary | ICD-10-CM

## 2012-02-16 DIAGNOSIS — T84498A Other mechanical complication of other internal orthopedic devices, implants and grafts, initial encounter: Secondary | ICD-10-CM | POA: Diagnosis present

## 2012-02-16 DIAGNOSIS — F3289 Other specified depressive episodes: Secondary | ICD-10-CM | POA: Diagnosis present

## 2012-02-16 DIAGNOSIS — J449 Chronic obstructive pulmonary disease, unspecified: Secondary | ICD-10-CM | POA: Diagnosis present

## 2012-02-16 DIAGNOSIS — Y92009 Unspecified place in unspecified non-institutional (private) residence as the place of occurrence of the external cause: Secondary | ICD-10-CM

## 2012-02-16 DIAGNOSIS — Z79899 Other long term (current) drug therapy: Secondary | ICD-10-CM

## 2012-02-16 DIAGNOSIS — J4489 Other specified chronic obstructive pulmonary disease: Secondary | ICD-10-CM | POA: Diagnosis present

## 2012-02-16 DIAGNOSIS — IMO0002 Reserved for concepts with insufficient information to code with codable children: Principal | ICD-10-CM | POA: Diagnosis present

## 2012-02-16 DIAGNOSIS — Y831 Surgical operation with implant of artificial internal device as the cause of abnormal reaction of the patient, or of later complication, without mention of misadventure at the time of the procedure: Secondary | ICD-10-CM | POA: Diagnosis present

## 2012-02-16 DIAGNOSIS — Z96659 Presence of unspecified artificial knee joint: Secondary | ICD-10-CM

## 2012-02-16 DIAGNOSIS — S42001K Fracture of unspecified part of right clavicle, subsequent encounter for fracture with nonunion: Secondary | ICD-10-CM

## 2012-02-16 DIAGNOSIS — F329 Major depressive disorder, single episode, unspecified: Secondary | ICD-10-CM

## 2012-02-16 HISTORY — PX: HARDWARE REMOVAL: SHX979

## 2012-02-16 HISTORY — PX: ORIF CLAVICULAR FRACTURE: SHX5055

## 2012-02-16 HISTORY — PX: HARVEST BONE GRAFT: SHX377

## 2012-02-16 LAB — GRAM STAIN

## 2012-02-16 SURGERY — OPEN REDUCTION INTERNAL FIXATION (ORIF) CLAVICULAR FRACTURE
Anesthesia: General | Site: Shoulder | Laterality: Right | Wound class: Clean

## 2012-02-16 MED ORDER — HEMOSTATIC AGENTS (NO CHARGE) OPTIME
TOPICAL | Status: DC | PRN
  Administered 2012-02-16 (×2): 1 via TOPICAL

## 2012-02-16 MED ORDER — ONDANSETRON HCL 4 MG PO TABS: 4.0000 mg | ORAL_TABLET | Freq: Four times a day (QID) | ORAL | Status: DC | PRN

## 2012-02-16 MED ORDER — METHOCARBAMOL 100 MG/ML IJ SOLN
500.0000 mg | Freq: Four times a day (QID) | INTRAVENOUS | Status: DC | PRN
  Filled 2012-02-16: qty 10

## 2012-02-16 MED ORDER — BUPIVACAINE-EPINEPHRINE PF 0.25-1:200000 % IJ SOLN
INTRAMUSCULAR | Status: DC | PRN
  Administered 2012-02-16: 15 mL
  Administered 2012-02-16: 10 mL

## 2012-02-16 MED ORDER — FEXOFENADINE-PSEUDOEPHED ER 60-120 MG PO TB12: 1.0000 | ORAL_TABLET | Freq: Two times a day (BID) | ORAL | Status: DC

## 2012-02-16 MED ORDER — LACTATED RINGERS IV SOLN: INTRAVENOUS | Status: DC

## 2012-02-16 MED ORDER — METOCLOPRAMIDE HCL 10 MG PO TABS: 5.0000 mg | ORAL_TABLET | Freq: Three times a day (TID) | ORAL | Status: DC | PRN

## 2012-02-16 MED ORDER — HYDROMORPHONE HCL PF 1 MG/ML IJ SOLN: 0.2500 mg | INTRAMUSCULAR | Status: DC | PRN

## 2012-02-16 MED ORDER — ALBUTEROL SULFATE (5 MG/ML) 0.5% IN NEBU
2.5000 mg | INHALATION_SOLUTION | Freq: Four times a day (QID) | RESPIRATORY_TRACT | Status: DC | PRN
  Administered 2012-02-18: 2.5 mg via RESPIRATORY_TRACT
  Filled 2012-02-16: qty 0.5

## 2012-02-16 MED ORDER — MIDAZOLAM HCL 5 MG/5ML IJ SOLN
INTRAMUSCULAR | Status: DC | PRN
  Administered 2012-02-16: 2 mg via INTRAVENOUS

## 2012-02-16 MED ORDER — ACETAMINOPHEN 650 MG RE SUPP: 650.0000 mg | Freq: Four times a day (QID) | RECTAL | Status: DC | PRN

## 2012-02-16 MED ORDER — OXYCODONE-ACETAMINOPHEN 5-325 MG PO TABS: 1.0000 | ORAL_TABLET | ORAL | Status: DC | PRN

## 2012-02-16 MED ORDER — LORATADINE 10 MG PO TABS
10.0000 mg | ORAL_TABLET | Freq: Two times a day (BID) | ORAL | Status: DC
  Administered 2012-02-16 – 2012-02-18 (×4): 10 mg via ORAL
  Filled 2012-02-16 (×6): qty 1

## 2012-02-16 MED ORDER — POTASSIUM CHLORIDE IN NACL 20-0.9 MEQ/L-% IV SOLN
INTRAVENOUS | Status: DC
  Administered 2012-02-17: 05:00:00 via INTRAVENOUS
  Filled 2012-02-16 (×2): qty 1000

## 2012-02-16 MED ORDER — MENTHOL 3 MG MT LOZG: 1.0000 | LOZENGE | OROMUCOSAL | Status: DC | PRN

## 2012-02-16 MED ORDER — PAROXETINE HCL 20 MG PO TABS
20.0000 mg | ORAL_TABLET | Freq: Every day | ORAL | Status: DC
  Administered 2012-02-17 – 2012-02-18 (×2): 20 mg via ORAL
  Filled 2012-02-16 (×3): qty 1

## 2012-02-16 MED ORDER — ACETAMINOPHEN 325 MG PO TABS: 650.0000 mg | ORAL_TABLET | Freq: Four times a day (QID) | ORAL | Status: DC | PRN

## 2012-02-16 MED ORDER — ONDANSETRON HCL 4 MG/2ML IJ SOLN: 4.0000 mg | Freq: Four times a day (QID) | INTRAMUSCULAR | Status: DC | PRN

## 2012-02-16 MED ORDER — PSEUDOEPHEDRINE HCL ER 120 MG PO TB12
120.0000 mg | ORAL_TABLET | Freq: Two times a day (BID) | ORAL | Status: DC
  Administered 2012-02-16 – 2012-02-18 (×4): 120 mg via ORAL
  Filled 2012-02-16 (×6): qty 1

## 2012-02-16 MED ORDER — ONDANSETRON HCL 4 MG/2ML IJ SOLN
INTRAMUSCULAR | Status: DC | PRN
  Administered 2012-02-16: 4 mg via INTRAVENOUS

## 2012-02-16 MED ORDER — PROPOFOL 10 MG/ML IV BOLUS
INTRAVENOUS | Status: DC | PRN
  Administered 2012-02-16: 170 mg via INTRAVENOUS

## 2012-02-16 MED ORDER — FLUTICASONE-SALMETEROL 115-21 MCG/ACT IN AERO
1.0000 | INHALATION_SPRAY | Freq: Every day | RESPIRATORY_TRACT | Status: DC
  Filled 2012-02-16 (×2): qty 8

## 2012-02-16 MED ORDER — BUPIVACAINE-EPINEPHRINE PF 0.25-1:200000 % IJ SOLN
INTRAMUSCULAR | Status: AC
  Filled 2012-02-16: qty 30

## 2012-02-16 MED ORDER — METOCLOPRAMIDE HCL 5 MG/ML IJ SOLN: 5.0000 mg | Freq: Three times a day (TID) | INTRAMUSCULAR | Status: DC | PRN

## 2012-02-16 MED ORDER — ONDANSETRON HCL 4 MG/2ML IJ SOLN: 4.0000 mg | Freq: Once | INTRAMUSCULAR | Status: DC | PRN

## 2012-02-16 MED ORDER — LACTATED RINGERS IV SOLN
INTRAVENOUS | Status: DC | PRN
  Administered 2012-02-16 (×3): via INTRAVENOUS

## 2012-02-16 MED ORDER — EPHEDRINE SULFATE 50 MG/ML IJ SOLN
INTRAMUSCULAR | Status: DC | PRN
  Administered 2012-02-16: 5 mg via INTRAVENOUS
  Administered 2012-02-16 (×3): 10 mg via INTRAVENOUS

## 2012-02-16 MED ORDER — CEFAZOLIN SODIUM-DEXTROSE 2-3 GM-% IV SOLR
2.0000 g | Freq: Three times a day (TID) | INTRAVENOUS | Status: AC
  Administered 2012-02-16 – 2012-02-17 (×3): 2 g via INTRAVENOUS
  Filled 2012-02-16 (×3): qty 50

## 2012-02-16 MED ORDER — OXYCODONE HCL 5 MG PO TABS
5.0000 mg | ORAL_TABLET | ORAL | Status: DC | PRN
  Administered 2012-02-16 – 2012-02-18 (×9): 10 mg via ORAL
  Filled 2012-02-16 (×11): qty 2

## 2012-02-16 MED ORDER — LIDOCAINE HCL (CARDIAC) 20 MG/ML IV SOLN
INTRAVENOUS | Status: DC | PRN
  Administered 2012-02-16: 50 mg via INTRAVENOUS

## 2012-02-16 MED ORDER — ROCURONIUM BROMIDE 100 MG/10ML IV SOLN
INTRAVENOUS | Status: DC | PRN
  Administered 2012-02-16: 20 mg via INTRAVENOUS
  Administered 2012-02-16: 50 mg via INTRAVENOUS

## 2012-02-16 MED ORDER — CHLORHEXIDINE GLUCONATE 4 % EX LIQD: 60.0000 mL | Freq: Once | CUTANEOUS | Status: DC

## 2012-02-16 MED ORDER — PHENOL 1.4 % MT LIQD: 1.0000 | OROMUCOSAL | Status: DC | PRN

## 2012-02-16 MED ORDER — TIOTROPIUM BROMIDE MONOHYDRATE 18 MCG IN CAPS
18.0000 ug | ORAL_CAPSULE | Freq: Every day | RESPIRATORY_TRACT | Status: DC
  Administered 2012-02-16 – 2012-02-18 (×3): 18 ug via RESPIRATORY_TRACT
  Filled 2012-02-16: qty 5

## 2012-02-16 MED ORDER — FENTANYL CITRATE 0.05 MG/ML IJ SOLN
INTRAMUSCULAR | Status: DC | PRN
  Administered 2012-02-16 (×2): 100 ug via INTRAVENOUS
  Administered 2012-02-16 (×2): 50 ug via INTRAVENOUS
  Administered 2012-02-16: 100 ug via INTRAVENOUS

## 2012-02-16 MED ORDER — DIPHENHYDRAMINE HCL 12.5 MG/5ML PO ELIX: 12.5000 mg | ORAL_SOLUTION | ORAL | Status: DC | PRN

## 2012-02-16 MED ORDER — METHOCARBAMOL 500 MG PO TABS
500.0000 mg | ORAL_TABLET | Freq: Four times a day (QID) | ORAL | Status: DC | PRN
  Administered 2012-02-16 – 2012-02-17 (×3): 1000 mg via ORAL
  Administered 2012-02-17 – 2012-02-18 (×2): 500 mg via ORAL
  Filled 2012-02-16: qty 1
  Filled 2012-02-16: qty 2
  Filled 2012-02-16: qty 1
  Filled 2012-02-16: qty 2
  Filled 2012-02-16: qty 1
  Filled 2012-02-16: qty 2

## 2012-02-16 MED ORDER — HYDROMORPHONE HCL PF 1 MG/ML IJ SOLN
0.5000 mg | INTRAMUSCULAR | Status: DC | PRN
  Administered 2012-02-16 – 2012-02-18 (×5): 1 mg via INTRAVENOUS
  Filled 2012-02-16 (×5): qty 1

## 2012-02-16 MED ORDER — ALUM & MAG HYDROXIDE-SIMETH 200-200-20 MG/5ML PO SUSP: 30.0000 mL | ORAL | Status: DC | PRN

## 2012-02-16 SURGICAL SUPPLY — 59 items
BRUSH SCRUB DISP (MISCELLANEOUS) ×12 IMPLANT
BUR ROUND PRECISION 4.0 (BURR) ×3 IMPLANT
CLOTH BEACON ORANGE TIMEOUT ST (SAFETY) ×3 IMPLANT
CONT SPEC STER OR (MISCELLANEOUS) ×3 IMPLANT
COVER SURGICAL LIGHT HANDLE (MISCELLANEOUS) ×3 IMPLANT
DRAPE C-ARM 42X72 X-RAY (DRAPES) ×3 IMPLANT
DRAPE C-ARMOR (DRAPES) IMPLANT
DRAPE INCISE IOBAN 66X45 STRL (DRAPES) ×3 IMPLANT
DRAPE ORTHO SPLIT 77X108 STRL (DRAPES) ×3
DRAPE SURG ORHT 6 SPLT 77X108 (DRAPES) ×6 IMPLANT
DRAPE U-SHAPE 47X51 STRL (DRAPES) ×3 IMPLANT
DRSG EMULSION OIL 3X3 NADH (GAUZE/BANDAGES/DRESSINGS) IMPLANT
DRSG MEPILEX BORDER 4X4 (GAUZE/BANDAGES/DRESSINGS) ×3 IMPLANT
DRSG MEPILEX BORDER 4X8 (GAUZE/BANDAGES/DRESSINGS) ×3 IMPLANT
ELECT NEEDLE TIP 2.8 STRL (NEEDLE) IMPLANT
ELECT REM PT RETURN 9FT ADLT (ELECTROSURGICAL) ×3
ELECTRODE REM PT RTRN 9FT ADLT (ELECTROSURGICAL) ×2 IMPLANT
GLOVE BIO SURGEON STRL SZ7.5 (GLOVE) ×3 IMPLANT
GLOVE BIO SURGEON STRL SZ8 (GLOVE) ×6 IMPLANT
GLOVE BIO SURGEON STRL SZ8.5 (GLOVE) ×6 IMPLANT
GLOVE BIOGEL PI IND STRL 7.5 (GLOVE) ×2 IMPLANT
GLOVE BIOGEL PI IND STRL 8 (GLOVE) ×10 IMPLANT
GLOVE BIOGEL PI INDICATOR 7.5 (GLOVE) ×1
GLOVE BIOGEL PI INDICATOR 8 (GLOVE) ×5
GLOVE SURG SS PI 8.0 STRL IVOR (GLOVE) ×9 IMPLANT
GLOVE SURG SS PI 8.5 STRL IVOR (GLOVE) ×2
GLOVE SURG SS PI 8.5 STRL STRW (GLOVE) ×4 IMPLANT
GOWN PREVENTION PLUS XLARGE (GOWN DISPOSABLE) ×6 IMPLANT
GOWN STRL NON-REIN LRG LVL3 (GOWN DISPOSABLE) ×3 IMPLANT
GOWN STRL REIN XL XLG (GOWN DISPOSABLE) ×15 IMPLANT
KIT BASIN OR (CUSTOM PROCEDURE TRAY) ×3 IMPLANT
KIT ROOM TURNOVER OR (KITS) ×3 IMPLANT
MANIFOLD NEPTUNE II (INSTRUMENTS) IMPLANT
NEEDLE HYPO 25GX1X1/2 BEV (NEEDLE) ×3 IMPLANT
NS IRRIG 1000ML POUR BTL (IV SOLUTION) ×3 IMPLANT
PACK TOTAL JOINT (CUSTOM PROCEDURE TRAY) ×3 IMPLANT
PAD ARMBOARD 7.5X6 YLW CONV (MISCELLANEOUS) ×6 IMPLANT
SPONGE GAUZE 4X4 12PLY (GAUZE/BANDAGES/DRESSINGS) IMPLANT
SPONGE LAP 18X18 X RAY DECT (DISPOSABLE) ×6 IMPLANT
SPONGE SURGIFOAM ABS GEL SZ50 (HEMOSTASIS) ×6 IMPLANT
STRIP CLOSURE SKIN 1/2X4 (GAUZE/BANDAGES/DRESSINGS) IMPLANT
SUCTION FRAZIER TIP 10 FR DISP (SUCTIONS) ×3 IMPLANT
SUT ETHILON 3 0 PS 1 (SUTURE) ×3 IMPLANT
SUT MNCRL AB 4-0 PS2 18 (SUTURE) IMPLANT
SUT PROLENE 3 0 PS 1 (SUTURE) IMPLANT
SUT VIC AB 0 CT1 27 (SUTURE) ×1
SUT VIC AB 0 CT1 27XBRD ANBCTR (SUTURE) ×2 IMPLANT
SUT VIC AB 1 CT1 27 (SUTURE) ×1
SUT VIC AB 1 CT1 27XBRD ANBCTR (SUTURE) ×2 IMPLANT
SUT VIC AB 2-0 CT1 27 (SUTURE) ×1
SUT VIC AB 2-0 CT1 TAPERPNT 27 (SUTURE) ×2 IMPLANT
SUT VIC AB 2-0 CT3 27 (SUTURE) IMPLANT
SWAB COLLECTION DEVICE MRSA (MISCELLANEOUS) ×3 IMPLANT
SYR CONTROL 10ML LL (SYRINGE) ×3 IMPLANT
TOWEL OR 17X24 6PK STRL BLUE (TOWEL DISPOSABLE) ×6 IMPLANT
TOWEL OR 17X26 10 PK STRL BLUE (TOWEL DISPOSABLE) ×6 IMPLANT
TUBE ANAEROBIC SPECIMEN COL (MISCELLANEOUS) ×3 IMPLANT
WATER STERILE IRR 1000ML POUR (IV SOLUTION) IMPLANT
YANKAUER SUCT BULB TIP NO VENT (SUCTIONS) ×3 IMPLANT

## 2012-02-16 NOTE — H&P (Signed)
[facility] H&P  CC: R clavicle nonunion  HPI: 71 y/o RHD male underwent repair of R clavicle 10 years ago. Approximately, 6 years ago he underwent office drainage procedure of an abscess over the area without any subsequent infection.  He has had continued pain at rest and with activity.  Difficulty sleeping and doing overhead activities. Denies current numbness or tingling (recent carpal tunnel release)  Past Medical History  Diagnosis Date  . COPD (chronic obstructive pulmonary disease)   . Shortness of breath     with exertion   . Arthritis     knees and back   . Depression     ptsd   . Pneumonia    Past Surgical History  Procedure Date  . Other surgical history     cyst removed under left ear  and under right arm   . Appendectomy   . Other surgical history     left leg surgery   . Other surgical history     right leg surgery   . Other surgical history     right facial surgery   . Hemorrhoid surgery   . Other surgical history     nasal surgery   . Pilonidal cyst / sinus excision   . Other surgical history     left foot surgey and left great toe surgery   . Other surgical history     right collar bone surgery   . Carpal tunnel release     right   . Cervical fusion   . Subdural hematoma evacuation via craniotomy   . Total knee arthroplasty 10/19/2011    Procedure: TOTAL KNEE ARTHROPLASTY;  Surgeon: Loanne Drilling, MD;  Location: [facility];  Service: Orthopedics;  Laterality: Left;  . Joint replacement 2013    left knee replaced .   History reviewed. No pertinent family history.  No Known Allergies  History   Social History  . Marital Status: Married    Spouse Name: N/A    Number of Children: N/A  . Years of Education: N/A   Occupational History  . Not on file.   Social History Main Topics  . Smoking status: Former Smoker    Quit date: 06/02/1987  . Smokeless tobacco: Former Neurosurgeon  . Alcohol Use: No     quit 1989  . Drug Use: No  .  Sexually Active:    Other Topics Concern  . Not on file   Social History Narrative  . No narrative on file    Medications Prior to Admission  Medication Sig Dispense Refill  . albuterol (PROVENTIL) (2.5 MG/3ML) 0.083% nebulizer solution Take 2.5 mg by nebulization 4 (four) times daily.       . bisacodyl (DULCOLAX) 10 MG suppository Place 10 mg rectally as needed.      . diclofenac (VOLTAREN) 75 MG EC tablet Take 75 mg by mouth 2 (two) times daily.      [name] Fexofenadine-Pseudoephedrine (ALLEGRA-D 12 HOUR PO) Take 1 tablet by mouth every 12 (twelve) hours.      . fluticasone-salmeterol (ADVAIR HFA) 115-21 MCG/ACT inhaler Inhale 1 puff into the lungs daily.       [name] PARoxetine (PAXIL) 20 MG tablet Take 20 mg by mouth daily with breakfast.      . tiotropium (SPIRIVA) 18 MCG inhalation capsule Place 18 mcg into inhaler and inhale daily.       Review of Systems  Constitutional: Negative for fever, chills and malaise/fatigue.  Eyes: Negative for  blurred vision.  Respiratory: Negative for cough, shortness of breath and wheezing.   Cardiovascular: Negative for chest pain and palpitations.  Gastrointestinal: Negative for nausea, vomiting and abdominal pain.  Genitourinary: Negative for dysuria.  Musculoskeletal:       R shoulder pain  Neurological: Negative for tingling and headaches.   Physical Exam  Physical Exam  Constitutional: He is oriented to person, place, and time. He appears well-developed and well-nourished.  HENT:  Head: Normocephalic.  Cardiovascular: Regular rhythm.   Pulmonary/Chest: Effort normal.  Abdominal: Bowel sounds are normal.  Musculoskeletal:       Right Upper extremity    Full AROM R shoulder    Occasional pain with motion    + Hawkins and Neer impingement signs    Strength 5/5 but some pain with ER and abduction     TTP R clavicle    Neg spurlings test    Good neck ROM    Sensory functions intact    + radial pulse  Neurological: He is alert and oriented  to person, place, and time.   CBC    Component Value Date/Time   WBC 7.4 02/15/2012 1353   RBC 4.90 02/15/2012 1353   HGB 14.7 02/15/2012 1353   HCT 43.2 02/15/2012 1353   PLT 172 02/15/2012 1353   MCV 88.2 02/15/2012 1353   MCH 30.0 02/15/2012 1353   MCHC 34.0 02/15/2012 1353   RDW 14.1 02/15/2012 1353   LYMPHSABS 2.0 02/15/2012 1353   MONOABS 0.5 02/15/2012 1353   EOSABS 0.1 02/15/2012 1353   BASOSABS 0.0 02/15/2012 1353    BMET    Component Value Date/Time   NA 137 02/15/2012 1353   K 4.4 02/15/2012 1353   CL 103 02/15/2012 1353   CO2 26 02/15/2012 1353   GLUCOSE 97 02/15/2012 1353   BUN 16 02/15/2012 1353   CREATININE 0.75 02/15/2012 1353   CALCIUM 9.2 02/15/2012 1353   GFRNONAA >90 02/15/2012 1353   GFRAA >90 02/15/2012 1353   ESR: 0 CRP: <0.5  Xray: office films show distal third clavicle fx with hardware protruding through undersurface with large HO in supraspinatus tendon. Probable nonunion of original fx  A/P  71 y/o RHD male with R clavicle nonunion  To OR for repair of nonunion Infection unlikely as all lab normal.  However, it is completely possible that it is localized and contained to his clavicle given the length of time the pt has been dealing with this Will check cultures intra-op Possible d/c home after surgery vs Overnight observation  Mearl Latin, PA-C [facility] [phone] (P) 02/16/2012 7:44 AM

## 2012-02-16 NOTE — Transfer of Care (Signed)
Immediate Anesthesia Transfer of Care Note  Patient: [name] [name]  Procedure(s) Performed: Procedure(s) (LRB) with comments: OPEN REDUCTION INTERNAL FIXATION (ORIF) CLAVICULAR FRACTURE (Right) - Removal of hardware, ORIF of Clavicle, and allograft bone harvesting.  HARVEST ILIAC BONE GRAFT (Right) - right iliac bone graft harvest HARDWARE REMOVAL (Right) - removal of hardware from right clavicle  Patient Location: PACU  Anesthesia Type: General  Level of Consciousness: awake, alert  and oriented  Airway & Oxygen Therapy: Patient Spontanous Breathing and Patient connected to nasal cannula oxygen  Post-op Assessment: Report given to PACU RN, Post -op Vital signs reviewed and stable and Patient moving all extremities  Post vital signs: Reviewed and stable  Complications: No apparent anesthesia complications

## 2012-02-16 NOTE — Preoperative (Signed)
Beta Blockers   Reason not to administer Beta Blockers:Not Applicable 

## 2012-02-16 NOTE — Anesthesia Preprocedure Evaluation (Signed)
Anesthesia Evaluation  Patient identified by MRN, date of birth, ID band Patient awake    Reviewed: Allergy & Precautions, H&P , NPO status , Patient's Chart, lab work & pertinent test results  Airway Mallampati: I      Dental  (+) Teeth Intact and Dental Advisory Given   Pulmonary  breath sounds clear to auscultation        Cardiovascular Rhythm:Regular Rate:Normal     Neuro/Psych    GI/Hepatic   Endo/Other    Renal/GU      Musculoskeletal   Abdominal   Peds  Hematology   Anesthesia Other Findings   Reproductive/Obstetrics                           Anesthesia Physical Anesthesia Plan  ASA: II  Anesthesia Plan: General   Post-op Pain Management:    Induction: Intravenous  Airway Management Planned: Oral ETT  Additional Equipment:   Intra-op Plan:   Post-operative Plan: Extubation in OR  Informed Consent: I have reviewed the patients History and Physical, chart, labs and discussed the procedure including the risks, benefits and alternatives for the proposed anesthesia with the patient or authorized representative who has indicated his/her understanding and acceptance.   Dental advisory given  Plan Discussed with: CRNA and Surgeon  Anesthesia Plan Comments: (COPD lungs clear R. Clavicle Fracture  Plan GA with interscalene block)        Anesthesia Quick Evaluation

## 2012-02-16 NOTE — Anesthesia Procedure Notes (Addendum)
Anesthesia Regional Block:  Interscalene brachial plexus block  Pre-Anesthetic Checklist: ,, timeout performed, Correct Patient, Correct Site, Correct Laterality, Correct Procedure, Correct Position, site marked, Risks and benefits discussed,  Surgical consent,  Pre-op evaluation,  At surgeon's request and post-op pain management  Laterality: Right  Prep: chloraprep       Needles:   Needle Type: Echogenic Stimulator Needle      Needle Gauge: 22 and 22 G    Additional Needles: Interscalene brachial plexus block Narrative:  Start time: 02/16/2012 7:10 AM End time: 02/16/2012 7:20 AM  Performed by: Personally   Additional Notes: 20 cc 0.5% Marcaine 1:200 Epi   Procedure Name: Intubation Date/Time: 02/16/2012 8:20 AM Performed by: Rossie Muskrat L Pre-anesthesia Checklist: Patient identified, Timeout performed, Emergency Drugs available, Suction available and Patient being monitored Patient Re-evaluated:Patient Re-evaluated prior to inductionOxygen Delivery Method: Circle system utilized Preoxygenation: Pre-oxygenation with 100% oxygen Intubation Type: IV induction Ventilation: Two handed mask ventilation required and Oral airway inserted - appropriate to patient size Laryngoscope Size: Miller and 2 Grade View: Grade I Tube type: Oral Tube size: 7.5 mm Number of attempts: 1 Airway Equipment and Method: Stylet Placement Confirmation: ETT inserted through vocal cords under direct vision,  breath sounds checked- equal and bilateral and positive ETCO2 Secured at: 22 cm Tube secured with: Tape Dental Injury: Teeth and Oropharynx as per pre-operative assessment

## 2012-02-16 NOTE — H&P (Signed)
I have seen and examined the patient. I agree with the findings above. I discussed with the patient the risks and benefits of surgery, including the possibility of persistent nonunion, infection, nerve injury, vessel injury, wound breakdown, arthritis, symptomatic hardware, DVT/ PE, loss of motion, failure to alleviate all symptoms, and need for further surgery among others.  He understands these risks and wishes to proceed.  Budd Palmer, MD 02/16/2012 8:06 AM

## 2012-02-16 NOTE — Brief Op Note (Signed)
02/16/2012  11:58 AM  PATIENT:  [name]  71 y.o. male  PRE-OPERATIVE DIAGNOSIS:  NONUNION RIGHT CLAVICLE  POST-OPERATIVE DIAGNOSIS:   1. NONUNION RIGHT CLAVICLE, pseudoarthrosis 2. Metallosis  PROCEDURES:   1. Open repair of nonunion 2. Iliac crest tricortical autografting 3. Removal of deep implant  SURGEON:  Surgeon(s) and Role:    * Budd Palmer, MD - Primary  PHYSICIAN ASSISTANT: Montez Morita, [facility]  ANESTHESIA:   general with regional block  EBL:  Total I/O In: 2000 [I.V.:2000] Out: -   BLOOD ADMINISTERED:none  DRAINS: none   LOCAL MEDICATIONS USED:  MARCAINE    and NONE  SPECIMEN:  Source of Specimen:  nonunion right clavicle  DISPOSITION OF SPECIMEN:  micro and path  COUNTS:  YES  TOURNIQUET:  * No tourniquets in log *  DICTATION: .Other Dictation: Dictation Number [id number]  PLAN OF CARE: Admit for overnight observation  PATIENT DISPOSITION:  PACU - hemodynamically stable.   Delay start of Pharmacological VTE agent (>24hrs) due to surgical blood loss or risk of bleeding: no

## 2012-02-16 NOTE — Anesthesia Postprocedure Evaluation (Signed)
  Anesthesia Post-op Note  Patient: [name] [name]  Procedure(s) Performed: Procedure(s) (LRB) with comments: OPEN REDUCTION INTERNAL FIXATION (ORIF) CLAVICULAR FRACTURE (Right) - Removal of hardware, ORIF of Clavicle, and allograft bone harvesting.  HARVEST ILIAC BONE GRAFT (Right) - right iliac bone graft harvest HARDWARE REMOVAL (Right) - removal of hardware from right clavicle  Patient Location: PACU  Anesthesia Type: General and GA combined with regional for post-op pain  Level of Consciousness: awake, alert  and oriented  Airway and Oxygen Therapy: Patient Spontanous Breathing and Patient connected to nasal cannula oxygen  Post-op Pain: mild  Post-op Assessment: Post-op Vital signs reviewed and Patient's Cardiovascular Status Stable  Post-op Vital Signs: stable  Complications: No apparent anesthesia complications

## 2012-02-17 ENCOUNTER — Encounter (HOSPITAL_COMMUNITY): Payer: Self-pay | Admitting: Orthopedic Surgery

## 2012-02-17 NOTE — Progress Notes (Signed)
Orthopedic Tech Progress Note Patient Details:  [name] [name] 04/17/1941 [id number] OHF with Trapeze bar placed on patient bed per Ortho Tech orders Patient ID: [name], male   DOB: 10/08/1940, 71 y.o.   MRN: [id number]   Orie Rout 02/17/2012, 1:18 PM

## 2012-02-17 NOTE — Progress Notes (Signed)
UR COMPLETED  

## 2012-02-17 NOTE — Op Note (Signed)
NAMEMarland [name]  TARVARIS, SLY NO.:  [id number]  MEDICAL RECORD NO.:  [id number]  LOCATION:  5N29C                        FACILITY:  [facility]  PHYSICIAN:  Doralee Albino. Carola Frost, M.D. DATE OF BIRTH:  [birth year]  DATE OF PROCEDURE:  02/16/2012 DATE OF DISCHARGE:                              OPERATIVE REPORT   PREOPERATIVE DIAGNOSES: 1. Nonunion, right clavicle. 2. Loose hardware, right clavicle.  POSTOPERATIVE DIAGNOSES: 1. Nonunion, right clavicle. 2. Loose hardware, right clavicle. 3. Metallosis.  PROCEDURES: 1. Open repair of right clavicle, nonunion. 2. Tricortical iliac crest autografting in addition to other     cancellous iliac crest autografting 3. Removal of hardware and debridement of metallosis.  SURGEON:  Doralee Albino. Carola Frost, MD  ASSISTANT:  Mearl Latin, PA-C  ANESTHESIA:  General.  COMPLICATIONS:  None.  ESTIMATED BLOOD LOSS:  Less than 100 mL.  FINDINGS:  In addition to metallosis, a large pseudocyst is present with clear fluid.  Gross mobility of the nonunion site and bone formation over the top of the medial aspect of the plate.  SPECIMENS:  Two anaerobic, aerobic cultures, and also a separate pseudocapsule.  Tissue sent for both culture and path analysis.  MICRO FINDINGS:  No bacteria.  DISPOSITION:  To PACU.  CONDITION:  Stable.  BRIEF SUMMARY AND INDICATION FOR PROCEDURE:  [name] [name] is a very pleasant 71 year old right-hand dominant male, who sustained a fracture treated with ORIF over 10 years ago.  Subsequent to that, he also underwent antibiotic treatment and in office drainage of a potential abscess.  He had no subsequent trouble with that preoperatively.  CRP and sed rate were negative for sign of infection or elevation.  CT scan confirmed a nonunion as well as a enormous spicule of callus extending down into the soft tissues but without clear evidence of bridging bone to the proximal side.  The fixation was questionably loose.   I discussed with him the risk of surgical repair including the possibility of infection, broken hardware, failure to achieve union, need for further surgery, and multiple others.  The patient did wish to proceed, understanding those risks including those associated with iliac crest autografting.  BRIEF SUMMARY OF PROCEDURE:  Mr. Bulger was given antibiotics preoperatively as well as a regional block.  He was taken to the OR, where general anesthesia was induced.  His left upper extremity and shoulder were then prepped and draped in usual sterile fashion.  The old incision was remade, dissection carried down to the clavicle which was completely covering the plate medially and then as I continued distally, I was able to see some of the hardware.  As I continued the dissection deeper, I encountered clear fluid.  This pocket was developed and cultures sent.  The nonunion site was developed entirely and debrided. The hardware was revealed by removing the bone which had grown over top of it.  I then facilitated removal.  There were extensive amounts of blackened pseudocapsule around this plate indicative of metallosis.  All of this darkened material was removed.  The nonunion site was mobile.  I did not dissect the finger-like extensions down into the subclavian area on the proximal side nor the CC ligament on the medial  side but did fully develop the nonunion area and debrided completely at the level of the clavicle.  Swabs would eventually reveal no evidence of bacteria. Many of the screws were completely floating and again had evidence of metallosis along the entire tract.  As they were debrided, it left a large Swiss cheese type holes within the bone with very little cancellous surface to work with.  The gap itself was 2 cm.  Because of this loss of length which the patient had hoped to restore as well as the poor healing environment and substantial bone loss associated both proximally  and distally.  Decision was made to proceed with tricortical as well as a cancellous bone grafting from the iliac crest.  A separate prep and drape was performed of the right iliac crest as the left had already been harvested.  Dissection was carried down to the brim in a amuscular plane.  I carefully watched for lateral femoral cutaneous nerve which was not encountered.  The osteotome was used to take a rectangular 2 x 1.5 cm tricortical wedge.  Additional large amounts of cancellous bone were then harvested yielding over 15 mL in addition to the tricortical piece.  The lamina spreader and my assistant were used to achieve enough distraction such that the tricortical wedge could be placed into it.  I did carefully contour the proximal and distal segments so that there was a square corner to achieve stability and reduced motion of the potential for migration.  This resulted in what appeared to be near anatomic fit and no other fixation was deemed necessary given the intrinsic stability with distraction and geometric shape.  Furthermore given the evidence of significant metallosis and soft tissue reaction and the question of infection pending final cultures, additional internal fixation was also deemed to be a risk. The cancellous graft was packed all around the clavicle and a deep closure performed with 0 Vicryl, then 2-0 Vicryl, and 3-0 nylon for the skin.  Sterile gently compressive dressing was applied and a sling.  The patient was awakened from anesthesia and transported to the PACU in stable condition. With respect to the iliac crest wound, the deep periosteal layer was repaired with 0 Vicryl, then 2-0 Vicryl, and 3-0 nylon.  Extensive amounts of Marcaine with epinephrine were injected around the periosteum into the soft tissues.  Gelfoam was also placed into the crest.  Montez Morita, PA-C assisted me with all portions of this procedure both debridement and repair of the nonunion  which absolutely required a assist in order to achieve distraction and placement of the tricortical graft as well as with bone graft harvest and closure to greatly expedite the case.  PROGNOSIS:  Mr. Blumenfeld will be in a sling for comfort on the right arm.  He can begin gentle PROM and AROM without abduction of the shoulder and we will graduate to lifting restrictions with active motion without excessive abduction in the ensuing 4-6 weeks.     Doralee Albino. Carola Frost, M.D.     MHH/MEDQ  D:  02/16/2012  T:  02/17/2012  Job:  [id number]

## 2012-02-17 NOTE — Progress Notes (Signed)
[facility]opaedic [facility]uma [facility] (OTS)  Subjective: 1 Day Post-Op Procedure(s) (LRB): OPEN REDUCTION INTERNAL FIXATION (ORIF) CLAVICULAR FRACTURE (Right) HARVEST ILIAC BONE GRAFT (Right) HARDWARE REMOVAL (Right)    Doing ok C/o pain L shoulder No CP, No SOB  Objective: Current Vitals Blood pressure 136/75, pulse 105, temperature 98.6 F (37 C), temperature source Oral, resp. rate 18, SpO2 94.00%. Vital signs in last 24 hours: Temp:  [97.1 F (36.2 C)-98.6 F (37 C)] 98.6 F (37 C) (09/18 0446) Pulse Rate:  [79-105] 105  (09/18 0446) Resp:  [16-23] 18  (09/18 0446) BP: (125-160)/(61-98) 136/75 mmHg (09/18 0446) SpO2:  [92 %-97 %] 94 % (09/18 0726)  Intake/Output from previous day: 09/17 0701 - 09/18 0700 In: 4360 [P.O.:1420; I.V.:2940] Out: 1550 [Urine:1050; Blood:500] Intake/Output      09/17 0701 - 09/18 0700 09/18 0701 - 09/19 0700   P.O. 1420    I.V. 2940    Total Intake 4360    Urine 1050    Blood 500    Total Output 1550    Net +2810         Urine Occurrence 4 x       LABS  Basename 02/15/12 1353  HGB 14.7    Basename 02/15/12 1353  WBC 7.4  RBC 4.90  HCT 43.2  PLT 172    Basename 02/15/12 1353  NA 137  K 4.4  CL 103  CO2 26  BUN 16  CREATININE 0.75  GLUCOSE 97  CALCIUM 9.2    Basename 02/15/12 1353  LABPT --  INR 0.92     Physical Exam  Gen: appears well, NAD Lungs: unlabored Cardiac: reg Ext:     Right Upper Extremity  Dressing with drainage  Sling  Ext warm  Motor and sensory functions intact    Imaging Dg Clavicle Right  02/16/2012  *RADIOLOGY REPORT*  Clinical Data: 71 year old male status post ORIF right clavicle.  RIGHT CLAVICLE - 2+ VIEWS  Comparison: Intraoperative radiographs from 0945 hours the same day.  Findings: Heterotopic and dystrophic ossification at the level of the distal right clavicle and acromioclavicular joint. Postoperative changes to the overlying soft tissues with a small volume of subcutaneous gas.   Cervical ACDF hardware again noted.  IMPRESSION: ORIF hardware removal from the distal right clavicle with dystrophic/heterotopic ossification remaining.   Original Report Authenticated By: Harley Hallmark, M.D.    Dg Clavicle Right  02/16/2012  *RADIOLOGY REPORT*  Clinical Data: ORIF of the right clavicle  DG C-ARM 1-60 MIN,RIGHT CLAVICLE - 2+ VIEWS  Comparison:  Chest radiograph - 07/17/2011; right clavicular CT - 01/29/2012;  Findings:  Seven spot intraoperative fluoroscopic images of the right clavicle are provided for review.  Images demonstrate removal of previously identified ORIF hardware involving the distal aspect of the right clavicle.  There is expected subcutaneous emphysema about the operative site.  Several skin staples overlie the operative site.  No radiopaque foreign body.  IMPRESSION: Interval removal of ORIF hardware of the distal end of the right clavicle.   Original Report Authenticated By: Waynard Reeds, M.D.    Dg C-arm 1-60 Min  02/16/2012  *RADIOLOGY REPORT*  Clinical Data: ORIF of the right clavicle  DG C-ARM 1-60 MIN,RIGHT CLAVICLE - 2+ VIEWS  Comparison:  Chest radiograph - 07/17/2011; right clavicular CT - 01/29/2012;  Findings:  Seven spot intraoperative fluoroscopic images of the right clavicle are provided for review.  Images demonstrate removal of previously identified ORIF hardware involving the distal aspect of  the right clavicle.  There is expected subcutaneous emphysema about the operative site.  Several skin staples overlie the operative site.  No radiopaque foreign body.  IMPRESSION: Interval removal of ORIF hardware of the distal end of the right clavicle.   Original Report Authenticated By: Waynard Reeds, M.D.     Assessment/Plan: 1 Day Post-Op Procedure(s) (LRB): OPEN REDUCTION INTERNAL FIXATION (ORIF) CLAVICULAR FRACTURE (Right) HARVEST ILIAC BONE GRAFT (Right) HARDWARE REMOVAL (Right)  Patient Active Hospital Problem List: No active hospital  problems.  71 y/o male with R clavicle nonunion  1. R clavicle nonunion repair  Sling for comfort  Pendulums  Gentle ROM  Ice  Dressing changes as needed 2. Pain  Continue with meds 3. FEN  Diet as tolerated 4. DVT/PE prophylaxis  No pharmacologics needed  Mobilize 5. Activity  Up ad lib    Mearl Latin, PA-C [facility] [phone] (P) 02/17/2012, 8:26 AM

## 2012-02-18 DIAGNOSIS — F329 Major depressive disorder, single episode, unspecified: Secondary | ICD-10-CM

## 2012-02-18 DIAGNOSIS — S42001K Fracture of unspecified part of right clavicle, subsequent encounter for fracture with nonunion: Secondary | ICD-10-CM

## 2012-02-18 LAB — WOUND CULTURE: Culture: NO GROWTH

## 2012-02-18 MED ORDER — OXYCODONE HCL 5 MG PO TABS: 5.0000 mg | ORAL_TABLET | ORAL | Status: DC | PRN

## 2012-02-18 MED ORDER — OXYCODONE-ACETAMINOPHEN 5-325 MG PO TABS: 1.0000 | ORAL_TABLET | Freq: Four times a day (QID) | ORAL | Status: DC | PRN

## 2012-02-18 NOTE — Discharge Summary (Signed)
[facility] (OTS)  Patient ID: [name] [name] MRN: [id number] DOB/AGE: 1941-05-29 71 y.o.  Admit date: 02/16/2012 Discharge date: 02/18/2012  Admission Diagnoses:   Right clavicle nonunion, chronic   COPD   Depression  Discharge Diagnoses:  Principal Problem:  *Fracture of right clavicle with nonunion Active Problems:  COPD (chronic obstructive pulmonary disease)  Depression   Procedures Performed:   ORIF R clavicle nonunion on 02/16/2012  Discharged Condition: good  Hospital Course:     Pt is a pleasant 71 y/o RHD caucasian male with chronic R clavicle nonunion.  (see H&P for full summary). Pt was admitted to the hospital on 02/16/2012 for ORIF of his R clavicle nonunion.  This was a technically successful procedure.  Pt was admitted after surgery for pain control and observation.  Pts hospital stay was relatively uncomplicated.  On POD 1 he did have some issues with pain control and was requiring IV pain medication to get relief.  No significant issues were noted on POD 1.  On POD 2 pt was doing much better, was moving around without difficulty and was deemed stable for d/c to home.  Pt was tolerating diet, voiding and receiving good pain control with PO meds on POD 2.  Consults: None  Significant Diagnostic Studies: microbiology: wound culture: NGTD and gram stain: no organisms  Treatments: IV hydration, antibiotics: Ancef, analgesia: Dilaudid, oxy IR and percocet, therapies: RN and surgery: as above  Discharge Exam:  Subjective:  2 Days Post-Op Procedure(s) (LRB):  OPEN REDUCTION INTERNAL FIXATION (ORIF) CLAVICULAR FRACTURE (Right)  HARVEST ILIAC BONE GRAFT (Right)  HARDWARE REMOVAL (Right)  Doing well  No issues  Ready to go home  Objective:  Current Vitals  Blood pressure 115/69, pulse 91, temperature 98.2 F (36.8 C), temperature source Oral, resp. rate 18, height 6\' 3"  (1.905 m), weight 95.255 kg (210 lb), SpO2 94.00%.  Vital signs in last  24 hours:  Temp: [97.3 F (36.3 C)-99 F (37.2 C)] 98.2 F (36.8 C) (09/19 0559)  Pulse Rate: [86-94] 91 (09/19 0559)  Resp: [18] 18 (09/19 0559)  BP: (105-122)/(62-74) 115/69 mmHg (09/19 0559)  SpO2: [94 %-95 %] 94 % (09/19 0559)  Weight: [95.255 kg (210 lb)] 95.255 kg (210 lb) (09/18 1945)  Intake/Output from previous day:  09/18 0701 - 09/19 0700  In: 1080 [P.O.:1040; I.V.:40]  Out: 900 [Urine:900]  Intake/Output  09/18 0701 - 09/19 0700 09/19 0701 - 09/20 0700  P.O. 1040  I.V. (mL/kg) 40 (0.4)  Total Intake(mL/kg) 1080 (11.3)  Urine (mL/kg/hr) 900 (0.4)  Blood  Total Output 900  Net +180  Urine Occurrence 4 x   LABS   Basename  02/15/12 1353   HGB  14.7     Basename  02/15/12 1353   WBC  7.4   RBC  4.90   HCT  43.2   PLT  172     Basename  02/15/12 1353   NA  137   K  4.4   CL  103   CO2  26   BUN  16   CREATININE  0.75   GLUCOSE  97   CALCIUM  9.2     Basename  02/15/12 1353   LABPT  --   INR  0.92    Physical Exam  Gen:NAD  Lungs:clear  Cardiac:reg  Abd: +BS  Pelvis: incision looks great, no signs of infection  Ext:  Right upper Extremity  Incision looks excellent, no signs of infection  Motor and sensory functions intact  Ext is warm  + radial pulse  Good PROM and AROM R elbow, forearm and wrist   Assessment/Plan:  2 Days Post-Op Procedure(s) (LRB):  OPEN REDUCTION INTERNAL FIXATION (ORIF) CLAVICULAR FRACTURE (Right)  HARVEST ILIAC BONE GRAFT (Right)  HARDWARE REMOVAL (Right)  71 y/o male with R clavicle nonunion  1. R clavicle nonunion repair  Sling for comfort  Pendulums  Gentle ROM  Shoulder ROM--> Flexion only, no abduction  Ice  Dressing changes as needed  2. Pain  Continue with meds  3. FEN  Diet as tolerated  4. DVT/PE prophylaxis  No pharmacologics needed  Mobilize  5. Activity  Up ad lib  6. Medical issues  Home meds  7. dispo  D/c home today   Disposition: 06-Home-Health Care Svc  Discharge Orders     Future Orders Please Complete By Expires   Diet general      Call MD / Call 911      Comments:   If you experience chest pain or shortness of breath, CALL 911 and be transported to the hospital emergency room.  If you develope a fever above 101 F, pus (white drainage) or increased drainage or redness at the wound, or calf pain, call your surgeon's office.   Constipation Prevention      Comments:   Drink plenty of fluids.  Prune juice may be helpful.  You may use a stool softener, such as Colace (over the counter) 100 mg twice a day.  Use MiraLax (over the counter) for constipation as needed.   Increase activity slowly as tolerated      Non weight bearing      Scheduling Instructions:   Right arm   Driving restrictions      Comments:   No driving   Lifting restrictions      Comments:   No lifting   Discharge instructions      Comments:   [facility] Discharge Instructions,   General Discharge Instructions  WEIGHT BEARING STATUS: No lifting or weight bearing through Right arm. Do not use right arm to help push or pull  RANGE OF MOTION/ACTIVITY: Range of motion of R elbow, forearm, wrist and hand as tolerated.  Forward flexion of Right shoulder only (bringing arm out  In front).  NO ABDUCTION of Right shoulder (abduction is bringing arm out to the side away from your body like a chicken wing).   Sling for comfort only.  Can remove sling when in bed or at rest  Diet: as you were eating previously.  Can use over the counter stool softeners and bowel preparations, such as Miralax, to help with bowel movements.  Narcotics can be constipating.  Be sure to drink plenty of fluids  STOP SMOKING OR USING NICOTINE PRODUCTS!!!!  As discussed nicotine severely impairs your body's ability to heal surgical and traumatic wounds but also impairs bone healing.  Wounds and bone heal by forming microscopic blood vessels (angiogenesis) and nicotine is a vasoconstrictor (essentially, shrinks  blood vessels).  Therefore, if vasoconstriction occurs to these microscopic blood vessels they essentially disappear and are unable to deliver necessary nutrients to the healing tissue.  This is one modifiable factor that you can do to dramatically increase your chances of healing your injury.    (This means no smoking, no nicotine gum, patches, etc)  DO NOT USE NONSTEROIDAL ANTI-INFLAMMATORY DRUGS (NSAID'S)  Using products such as Advil (ibuprofen), Aleve (naproxen), Motrin (ibuprofen) for additional pain control  during fracture healing can delay and/or prevent the healing response.  If you would like to take over the counter (OTC) medication, Tylenol (acetaminophen) is ok.  However, some narcotic medications that are given for pain control contain acetaminophen as well. Therefore, you should not exceed more than 4000 mg of tylenol in a day if you do not have liver disease.  Also note that there are may OTC medicines, such as cold medicines and allergy medicines that my contain tylenol as well.  If you have any questions about medications and/or interactions please ask your doctor/PA or your pharmacist.   PAIN MEDICATION USE AND EXPECTATIONS  You have likely been given narcotic medications to help control your pain.  After a traumatic event that results in an fracture (broken bone) with or without surgery, it is ok to use narcotic pain medications to help control one's pain.  We understand that everyone responds to pain differently and each individual patient will be evaluated on a regular basis for the continued need for narcotic medications. Ideally, narcotic medication use should last no more than 6-8 weeks (coinciding with fracture healing).   As a patient it is your responsibility as well to monitor narcotic medication use and report the amount and frequency you use these medications when you come to your office visit.   We would also advise that if you are using narcotic medications, you should take  a dose prior to therapy to maximize you participation.  IF YOU ARE ON NARCOTIC MEDICATIONS IT IS NOT PERMISSIBLE TO OPERATE A MOTOR VEHICLE (MOTORCYCLE/CAR/TRUCK/MOPED) OR HEAVY MACHINERY DO NOT MIX NARCOTICS WITH OTHER CNS (CENTRAL NERVOUS SYSTEM) DEPRESSANTS SUCH AS ALCOHOL       ICE AND ELEVATE INJURED/OPERATIVE EXTREMITY  Using ice and elevating the injured extremity above your heart can help with swelling and pain control.  Icing in a pulsatile fashion, such as 20 minutes on and 20 minutes off, can be followed.    Do not place ice directly on skin. Make sure there is a barrier between to skin and the ice pack.    Using frozen items such as frozen peas works well as the conform nicely to the are that needs to be iced.  USE AN ACE WRAP OR TED HOSE FOR SWELLING CONTROL  In addition to icing and elevation, Ace wraps or TED hose are used to help limit and resolve swelling.  It is recommended to use Ace wraps or TED hose until you are informed to stop.    When using Ace Wraps start the wrapping distally (farthest away from the body) and wrap proximally (closer to the body)   Example: If you had surgery on your leg or thing and you do not have a splint on, start the ace wrap at the toes and work your way up to the thigh        If you had surgery on your upper extremity and do not have a splint on, start the ace wrap at your fingers and work your way up to the upper arm  IF YOU ARE IN A SPLINT OR CAST DO NOT REMOVE IT FOR ANY REASON   If your splint gets wet for any reason please contact the office immediately. You may shower in your splint or cast as long as you keep it dry.  This can be done by wrapping in a cast cover or garbage back (or similar)  Do Not stick any thing down your splint or cast such as pencils, money,  or hangers to try and scratch yourself with.  If you feel itchy take benadryl as prescribed on the bottle for itching  IF YOU ARE IN A CAM BOOT (BLACK BOOT)  You may remove boot  periodically. Perform daily dressing changes as noted below.  Wash the liner of the boot regularly and wear a sock when wearing the boot. It is recommended that you sleep in the boot until told otherwise  CALL THE OFFICE WITH ANY QUESTIONS OR CONCERTS: [phone]     Discharge Pin Site Instructions  Dress pins daily with Kerlix roll starting on POD 2. Wrap the Kerlix so that it tamps the skin down around the pin-skin interface to prevent/limit motion of the skin relative to the pin.  (Pin-skin motion is the primary cause of pain and infection related to external fixator pin sites).  Remove any crust or coagulum that may obstruct drainage with a saline moistened gauze or soap and water.  After POD 3, if there is no discernable drainage on the pin site dressing, the interval for change can by increased to every other day.  You may shower with the fixator, cleaning all pin sites gently with soap and water.  If you have a surgical wound this needs to be completely dry and without drainage before showering.  The extremity can be lifted by the fixator to facilitate wound care and transfers.  Notify the office/Doctor if you experience increasing drainage, redness, or pain from a pin site, or if you notice purulent (thick, snot-like) drainage.  Discharge Wound Care Instructions  Do NOT apply any ointments, solutions or lotions to pin sites or surgical wounds.  These prevent needed drainage and even though solutions like hydrogen peroxide kill bacteria, they also damage cells lining the pin sites that help fight infection.  Applying lotions or ointments can keep the wounds moist and can cause them to breakdown and open up as well. This can increase the risk for infection. When in doubt call the office.  Surgical incisions should be dressed daily.  If any drainage is noted, use one layer of adaptic, then gauze, Kerlix, and an ace wrap.  Once the incision is completely dry and without drainage, it  may be left open to air out.  Showering may begin 36-48 hours later.  Cleaning gently with soap and water.  Traumatic wounds should be dressed daily as well.    One layer of adaptic, gauze, Kerlix, then ace wrap.  The adaptic can be discontinued once the draining has ceased    If you have a wet to dry dressing: wet the gauze with saline the squeeze as much saline out so the gauze is moist (not soaking wet), place moistened gauze over wound, then place a dry gauze over the moist one, followed by Kerlix wrap, then ace wrap.       Medication List     As of 02/18/2012  8:25 AM    STOP taking these medications         diclofenac 75 MG EC tablet   Commonly known as: VOLTAREN      TAKE these medications         albuterol (2.5 MG/3ML) 0.083% nebulizer solution   Commonly known as: PROVENTIL   Take 2.5 mg by nebulization 4 (four) times daily.      ALLEGRA-D 12 HOUR PO   Take 1 tablet by mouth every 12 (twelve) hours.      bisacodyl 10 MG suppository   Commonly known as:  DULCOLAX   Place 10 mg rectally as needed.      fluticasone-salmeterol 115-21 MCG/ACT inhaler   Commonly known as: ADVAIR HFA   Inhale 1 puff into the lungs daily.      oxyCODONE 5 MG immediate release tablet   Commonly known as: Oxy IR/ROXICODONE   Take 1-3 tablets (5-15 mg total) by mouth every 4 (four) hours as needed for pain (breakthrough pain between percocet doses).      oxyCODONE-acetaminophen 5-325 MG per tablet   Commonly known as: PERCOCET/ROXICET   Take 1-2 tablets by mouth every 6 (six) hours as needed for pain.      PARoxetine 20 MG tablet   Commonly known as: PAXIL   Take 20 mg by mouth daily with breakfast.      tiotropium 18 MCG inhalation capsule   Commonly known as: SPIRIVA   Place 18 mcg into inhaler and inhale daily.           Follow-up Information    Follow up with HANDY,MICHAEL H, MD. Schedule an appointment as soon as possible for a visit in 10 days.   Contact information:    [address] [place] [address] [phone]          Discharge Instructions and Plan:  Mr. Shaner will be in a sling for comfort on the right  arm. He can begin gentle PROM and AROM without abduction of the  shoulder and we will graduate to lifting restrictions with active motion  without excessive abduction in the ensuing 4-6 weeks.  Pt will follow up in 10 days for follow up xrays and wound check We reviewed wound care with the pt and instructions are Included in the discharge paperwork He will contact the office with any questions    Signed:  Mearl Latin, PA-C [facility] [phone] (P) 02/18/2012, 8:25 AM

## 2012-02-18 NOTE — Progress Notes (Signed)
[facility]opaedic Trauma Service (OTS)  Subjective: 2 Days Post-Op Procedure(s) (LRB): OPEN REDUCTION INTERNAL FIXATION (ORIF) CLAVICULAR FRACTURE (Right) HARVEST ILIAC BONE GRAFT (Right) HARDWARE REMOVAL (Right)  Doing well No issues Ready to go home  Objective: Current Vitals Blood pressure 115/69, pulse 91, temperature 98.2 F (36.8 C), temperature source Oral, resp. rate 18, height 6\' 3"  (1.905 m), weight 95.255 kg (210 lb), SpO2 94.00%. Vital signs in last 24 hours: Temp:  [97.3 F (36.3 C)-99 F (37.2 C)] 98.2 F (36.8 C) (09/19 0559) Pulse Rate:  [86-94] 91  (09/19 0559) Resp:  [18] 18  (09/19 0559) BP: (105-122)/(62-74) 115/69 mmHg (09/19 0559) SpO2:  [94 %-95 %] 94 % (09/19 0559) Weight:  [95.255 kg (210 lb)] 95.255 kg (210 lb) (09/18 1945)  Intake/Output from previous day: 09/18 0701 - 09/19 0700 In: 1080 [P.O.:1040; I.V.:40] Out: 900 [Urine:900] Intake/Output      09/18 0701 - 09/19 0700 09/19 0701 - 09/20 0700   P.O. 1040    I.V. (mL/kg) 40 (0.4)    Total Intake(mL/kg) 1080 (11.3)    Urine (mL/kg/hr) 900 (0.4)    Blood     Total Output 900    Net +180         Urine Occurrence 4 x       LABS  Basename 02/15/12 1353  HGB 14.7    Basename 02/15/12 1353  WBC 7.4  RBC 4.90  HCT 43.2  PLT 172    Basename 02/15/12 1353  NA 137  K 4.4  CL 103  CO2 26  BUN 16  CREATININE 0.75  GLUCOSE 97  CALCIUM 9.2    Basename 02/15/12 1353  LABPT --  INR 0.92     Physical Exam  Gen:NAD Lungs:clear Cardiac:reg Abd: +BS Pelvis: incision looks great, no signs of infection Ext:      Right upper Extremity  Incision looks excellent, no signs of infection  Motor and sensory functions intact  Ext is warm  + radial pulse  Good PROM and AROM  R elbow, forearm and wrist     I Assessment/Plan: 2 Days Post-Op Procedure(s) (LRB): OPEN REDUCTION INTERNAL FIXATION (ORIF) CLAVICULAR FRACTURE (Right) HARVEST ILIAC BONE GRAFT (Right) HARDWARE REMOVAL  (Right)  71 y/o male with R clavicle nonunion   1. R clavicle nonunion repair   Sling for comfort   Pendulums   Gentle ROM    Shoulder ROM--> Flexion only, no abduction  Ice   Dressing changes as needed  2. Pain   Continue with meds  3. FEN   Diet as tolerated  4. DVT/PE prophylaxis   No pharmacologics needed   Mobilize  5. Activity   Up ad lib 6. Medical issues  Home meds 7. dispo  D/c home today   Mearl Latin, PA-C [facility] [phone] (P) 02/18/2012, 8:15 AM

## 2012-02-19 LAB — TISSUE CULTURE

## 2012-02-19 NOTE — Progress Notes (Signed)
I have seen and examined the patient. I agree with the findings above.  Budd Palmer, MD 02/19/2012 3:29 PM

## 2012-02-21 LAB — ANAEROBIC CULTURE: Gram Stain: NONE SEEN

## 2012-11-23 ENCOUNTER — Other Ambulatory Visit: Payer: Self-pay | Admitting: Orthopedic Surgery

## 2012-11-23 DIAGNOSIS — M25511 Pain in right shoulder: Secondary | ICD-10-CM

## 2012-11-25 ENCOUNTER — Other Ambulatory Visit: Payer: Self-pay | Admitting: Orthopedic Surgery

## 2012-11-25 DIAGNOSIS — Z139 Encounter for screening, unspecified: Secondary | ICD-10-CM

## 2012-11-29 ENCOUNTER — Ambulatory Visit
Admission: RE | Admit: 2012-11-29 | Discharge: 2012-11-29 | Disposition: A | Discharge status: Home / Self Care | Payer: Medicare Other | Source: Ambulatory Visit | Attending: Orthopedic Surgery | Admitting: Orthopedic Surgery

## 2012-11-29 ENCOUNTER — Other Ambulatory Visit: Payer: Self-pay | Admitting: Orthopedic Surgery

## 2012-11-29 DIAGNOSIS — Z139 Encounter for screening, unspecified: Secondary | ICD-10-CM

## 2012-11-29 DIAGNOSIS — M25511 Pain in right shoulder: Secondary | ICD-10-CM

## 2012-12-07 ENCOUNTER — Ambulatory Visit
Admission: RE | Admit: 2012-12-07 | Discharge: 2012-12-07 | Disposition: A | Discharge status: Home / Self Care | Payer: Medicare Other | Source: Ambulatory Visit | Attending: Orthopedic Surgery | Admitting: Orthopedic Surgery

## 2012-12-15 ENCOUNTER — Encounter (HOSPITAL_COMMUNITY): Payer: Self-pay

## 2012-12-26 NOTE — Pre-Procedure Instructions (Signed)
[name] [name]  12/26/2012   Your procedure is scheduled on:  Friday December 30, 2012.  Report to [facility] at 6:00 AM.  Call this number if you have problems the morning of surgery: [phone]   Remember:   Do not eat food or drink liquids after midnight.   Take these medicines the morning of surgery with A SIP OF WATER: Albuterol nebulizer, Paroxetine (Paxil), and Allegra   Do not wear jewelry  Do not wear lotions, powders, or colognes. You may wear deodorant.  Men may shave face and neck.  Do not bring valuables to the hospital.  [facility] is not responsible for any belongings or valuables.  Contacts, dentures or bridgework may not be worn into surgery.  Leave suitcase in the car. After surgery it may be brought to your room.  For patients admitted to the hospital, checkout time is 11:00 AM the day of discharge.   Patients discharged the day of surgery will not be allowed to drive home.  Name and phone number of your driver: Family/Friend  Special Instructions: Shower using CHG 2 nights before surgery and the night before surgery.  If you shower the day of surgery use CHG.  Use special wash - you have one bottle of CHG for all showers.  You should use approximately 1/3 of the bottle for each shower.   Please read over the following fact sheets that you were given: Pain Booklet, Coughing and Deep Breathing and Surgical Site Infection Prevention

## 2012-12-27 ENCOUNTER — Encounter (HOSPITAL_COMMUNITY)
Admission: RE | Admit: 2012-12-27 | Discharge: 2012-12-27 | Disposition: A | Discharge status: Home / Self Care | Payer: Medicare Other | Source: Ambulatory Visit | Attending: Orthopedic Surgery | Admitting: Orthopedic Surgery

## 2012-12-27 ENCOUNTER — Encounter (HOSPITAL_COMMUNITY): Payer: Self-pay

## 2012-12-27 HISTORY — DX: Dependence on supplemental oxygen: Z99.81

## 2012-12-27 LAB — CBC
MCH: 32.1 pg (ref 26.0–34.0)
MCV: 92.1 fL (ref 78.0–100.0)
Platelets: 169 10*3/uL (ref 150–400)
RBC: 4.92 MIL/uL (ref 4.22–5.81)
RDW: 13.1 % (ref 11.5–15.5)
WBC: 7 10*3/uL (ref 4.0–10.5)

## 2012-12-27 LAB — BASIC METABOLIC PANEL
CO2: 28 mEq/L (ref 19–32)
Calcium: 9.7 mg/dL (ref 8.4–10.5)
Creatinine, Ser: 0.87 mg/dL (ref 0.50–1.35)
GFR calc non Af Amer: 85 mL/min — ABNORMAL LOW (ref >90)
Sodium: 140 mEq/L (ref 135–145)

## 2012-12-27 NOTE — Progress Notes (Signed)
PCP is Dr. Duanne Guess at [facility]. Patient informed Nurse that he had a stress test > 5 years ago, but denied having a cardiac cath. Patient did have a sleep study but denied having OSA.

## 2012-12-29 MED ORDER — CEFAZOLIN SODIUM-DEXTROSE 2-3 GM-% IV SOLR
2.0000 g | INTRAVENOUS | Status: AC
  Administered 2012-12-30: 2 g via INTRAVENOUS
  Filled 2012-12-29 (×2): qty 50

## 2012-12-29 NOTE — H&P (Signed)
[facility]uma [facility] H&P  Chief Complaint:  R clavicle nonunion HPI:   72 y/o white male well known to OTS for tx of R clavicle nonunion treated with revision ORIF and tricortical iliac crest autografting.  Pt with persistent nonunion and presents for repair of nonunion.   Past Medical History  Diagnosis Date  . COPD (chronic obstructive pulmonary disease)   . Shortness of breath     with exertion   . Arthritis     knees and back   . Depression     ptsd   . Pneumonia   . On supplemental oxygen therapy     2 liters if needed    Past Surgical History  Procedure Laterality Date  . Other surgical history      cyst removed under left ear  and under right arm   . Appendectomy    . Other surgical history      left leg surgery   . Other surgical history      right leg surgery   . Other surgical history      right facial surgery   . Hemorrhoid surgery    . Other surgical history      nasal surgery   . Pilonidal cyst / sinus excision    . Other surgical history      left foot surgey and left great toe surgery   . Other surgical history      right collar bone surgery   . Carpal tunnel release      right   . Cervical fusion    . Subdural hematoma evacuation via craniotomy    . Total knee arthroplasty  10/19/2011    Procedure: TOTAL KNEE ARTHROPLASTY;  Surgeon: Loanne Drilling, MD;  Location: [facility];  Service: Orthopedics;  Laterality: Left;  . Joint replacement  2013    left knee replaced .  [name] Orif clavicular fracture  02/16/2012    Procedure: OPEN REDUCTION INTERNAL FIXATION (ORIF) CLAVICULAR FRACTURE;  Surgeon: Budd Palmer, MD;  Location: [facility] OR;  Service: Orthopedics;  Laterality: Right;  Removal of hardware, ORIF of Clavicle, and allograft bone harvesting.   Carrington Clamp bone graft  02/16/2012    Procedure: HARVEST ILIAC BONE GRAFT;  Surgeon: Budd Palmer, MD;  Location: [facility] OR;  Service: Orthopedics;  Laterality: Right;  right iliac bone graft harvest  . Hardware  removal  02/16/2012    Procedure: HARDWARE REMOVAL;  Surgeon: Budd Palmer, MD;  Location: [facility] OR;  Service: Orthopedics;  Laterality: Right;  removal of hardware from right clavicle  . Shoulder surgery Right   . Colonoscopy    . Nose surgery    . Knee arthroscopy Bilateral   . Foot surgery Left     no "ball" on foot  . Hemorrhoid surgery      x 2    No family history on file. Social History:  reports that he quit smoking about 25 years ago. He has quit using smokeless tobacco. He reports that he does not drink alcohol or use illicit drugs.  Allergies: No Known Allergies  No prescriptions prior to admission    No results found for this or any previous visit (from the past 48 hour(s)). No results found.  Review of Systems  Musculoskeletal:       R shoulder pain   All other systems reviewed and are negative.   Physical Exam  Cardiovascular: Regular rhythm, S1 normal and S2 normal.   Respiratory:  Decreased at bases o/w clear  Musculoskeletal:  Right Upper Extremity   Motor and sensory functions intact   Ext warm   No gross deformities   Operative wound well healed     Neurological: He is alert.  Skin: Skin is warm and intact.    Xray R shoulder: nonunion of R clavicle   Assessment/Plan  72 y/o male with R clavicle nonunion   OR for repair of nonunion  Outpatient procedure   Mearl Latin, PA-C [facility] [phone] (P) 12/29/2012, 9:14 PM

## 2012-12-30 ENCOUNTER — Ambulatory Visit (HOSPITAL_COMMUNITY)
Admission: RE | Admit: 2012-12-30 | Discharge: 2013-01-01 | Disposition: A | Discharge status: Home / Self Care | Payer: Medicare Other | Source: Ambulatory Visit | Attending: Orthopedic Surgery | Admitting: Orthopedic Surgery

## 2012-12-30 ENCOUNTER — Ambulatory Visit (HOSPITAL_COMMUNITY): Payer: Medicare Other

## 2012-12-30 ENCOUNTER — Encounter (HOSPITAL_COMMUNITY): Payer: Self-pay | Admitting: *Deleted

## 2012-12-30 ENCOUNTER — Encounter (HOSPITAL_COMMUNITY): Payer: Self-pay | Admitting: Certified Registered"

## 2012-12-30 ENCOUNTER — Ambulatory Visit (HOSPITAL_COMMUNITY): Payer: Medicare Other | Admitting: Certified Registered"

## 2012-12-30 ENCOUNTER — Encounter (HOSPITAL_COMMUNITY)
Admission: RE | Disposition: A | Discharge status: Home / Self Care | Payer: Self-pay | Source: Ambulatory Visit | Attending: Orthopedic Surgery

## 2012-12-30 DIAGNOSIS — F3289 Other specified depressive episodes: Secondary | ICD-10-CM | POA: Insufficient documentation

## 2012-12-30 DIAGNOSIS — Z01818 Encounter for other preprocedural examination: Secondary | ICD-10-CM | POA: Insufficient documentation

## 2012-12-30 DIAGNOSIS — Z01810 Encounter for preprocedural cardiovascular examination: Secondary | ICD-10-CM | POA: Insufficient documentation

## 2012-12-30 DIAGNOSIS — J449 Chronic obstructive pulmonary disease, unspecified: Secondary | ICD-10-CM | POA: Insufficient documentation

## 2012-12-30 DIAGNOSIS — Z79899 Other long term (current) drug therapy: Secondary | ICD-10-CM | POA: Insufficient documentation

## 2012-12-30 DIAGNOSIS — F32A Depression, unspecified: Secondary | ICD-10-CM | POA: Diagnosis present

## 2012-12-30 DIAGNOSIS — Z01812 Encounter for preprocedural laboratory examination: Secondary | ICD-10-CM | POA: Insufficient documentation

## 2012-12-30 DIAGNOSIS — S42001K Fracture of unspecified part of right clavicle, subsequent encounter for fracture with nonunion: Secondary | ICD-10-CM | POA: Diagnosis present

## 2012-12-30 DIAGNOSIS — J4489 Other specified chronic obstructive pulmonary disease: Secondary | ICD-10-CM | POA: Insufficient documentation

## 2012-12-30 DIAGNOSIS — IMO0002 Reserved for concepts with insufficient information to code with codable children: Secondary | ICD-10-CM | POA: Insufficient documentation

## 2012-12-30 DIAGNOSIS — Z23 Encounter for immunization: Secondary | ICD-10-CM | POA: Insufficient documentation

## 2012-12-30 DIAGNOSIS — F329 Major depressive disorder, single episode, unspecified: Secondary | ICD-10-CM | POA: Diagnosis present

## 2012-12-30 HISTORY — PX: ORIF CLAVICULAR FRACTURE: SHX5055

## 2012-12-30 LAB — GRAM STAIN

## 2012-12-30 SURGERY — OPEN REDUCTION INTERNAL FIXATION (ORIF) CLAVICULAR FRACTURE
Anesthesia: General | Site: Shoulder | Laterality: Right | Wound class: Clean

## 2012-12-30 MED ORDER — METHOCARBAMOL 100 MG/ML IJ SOLN
500.0000 mg | Freq: Four times a day (QID) | INTRAVENOUS | Status: DC | PRN
  Filled 2012-12-30: qty 5

## 2012-12-30 MED ORDER — FEXOFENADINE-PSEUDOEPHED ER 60-120 MG PO TB12: 1.0000 | ORAL_TABLET | Freq: Two times a day (BID) | ORAL | Status: DC

## 2012-12-30 MED ORDER — ALBUTEROL SULFATE (5 MG/ML) 0.5% IN NEBU
2.5000 mg | INHALATION_SOLUTION | Freq: Three times a day (TID) | RESPIRATORY_TRACT | Status: DC
  Administered 2012-12-30 – 2013-01-01 (×6): 2.5 mg via RESPIRATORY_TRACT
  Filled 2012-12-30 (×5): qty 0.5

## 2012-12-30 MED ORDER — CEFAZOLIN SODIUM-DEXTROSE 2-3 GM-% IV SOLR
2.0000 g | Freq: Four times a day (QID) | INTRAVENOUS | Status: AC
  Administered 2012-12-30 – 2012-12-31 (×3): 2 g via INTRAVENOUS
  Filled 2012-12-30 (×3): qty 50

## 2012-12-30 MED ORDER — HYDROMORPHONE HCL PF 1 MG/ML IJ SOLN
0.2500 mg | INTRAMUSCULAR | Status: DC | PRN
  Administered 2012-12-30 (×4): 0.5 mg via INTRAVENOUS

## 2012-12-30 MED ORDER — LACTATED RINGERS IV SOLN: INTRAVENOUS | Status: DC

## 2012-12-30 MED ORDER — PROPOFOL 10 MG/ML IV BOLUS
INTRAVENOUS | Status: DC | PRN
  Administered 2012-12-30: 200 mg via INTRAVENOUS

## 2012-12-30 MED ORDER — OXYCODONE HCL 5 MG PO TABS
5.0000 mg | ORAL_TABLET | ORAL | Status: DC | PRN
  Administered 2012-12-30 – 2012-12-31 (×3): 10 mg via ORAL
  Filled 2012-12-30 (×3): qty 2

## 2012-12-30 MED ORDER — ACETAMINOPHEN 325 MG PO TABS: 650.0000 mg | ORAL_TABLET | Freq: Four times a day (QID) | ORAL | Status: DC | PRN

## 2012-12-30 MED ORDER — PHENOL 1.4 % MT LIQD: 1.0000 | OROMUCOSAL | Status: DC | PRN

## 2012-12-30 MED ORDER — OXYCODONE HCL 5 MG/5ML PO SOLN: 5.0000 mg | Freq: Once | ORAL | Status: DC | PRN

## 2012-12-30 MED ORDER — 0.9 % SODIUM CHLORIDE (POUR BTL) OPTIME
TOPICAL | Status: DC | PRN
  Administered 2012-12-30: 1000 mL

## 2012-12-30 MED ORDER — METOCLOPRAMIDE HCL 5 MG/ML IJ SOLN: 5.0000 mg | Freq: Three times a day (TID) | INTRAMUSCULAR | Status: DC | PRN

## 2012-12-30 MED ORDER — ONDANSETRON HCL 4 MG PO TABS: 4.0000 mg | ORAL_TABLET | Freq: Four times a day (QID) | ORAL | Status: DC | PRN

## 2012-12-30 MED ORDER — OXYCODONE-ACETAMINOPHEN 5-325 MG PO TABS
1.0000 | ORAL_TABLET | ORAL | Status: DC | PRN
  Administered 2012-12-30: 2 via ORAL
  Filled 2012-12-30: qty 2

## 2012-12-30 MED ORDER — ALUM & MAG HYDROXIDE-SIMETH 200-200-20 MG/5ML PO SUSP: 30.0000 mL | ORAL | Status: DC | PRN

## 2012-12-30 MED ORDER — MIDAZOLAM HCL 5 MG/5ML IJ SOLN
INTRAMUSCULAR | Status: DC | PRN
  Administered 2012-12-30: 2 mg via INTRAVENOUS

## 2012-12-30 MED ORDER — NEOSTIGMINE METHYLSULFATE 1 MG/ML IJ SOLN
INTRAMUSCULAR | Status: DC | PRN
  Administered 2012-12-30: 3 mg via INTRAVENOUS

## 2012-12-30 MED ORDER — HYDROMORPHONE HCL PF 1 MG/ML IJ SOLN
INTRAMUSCULAR | Status: AC
  Filled 2012-12-30: qty 1

## 2012-12-30 MED ORDER — FENTANYL CITRATE 0.05 MG/ML IJ SOLN
INTRAMUSCULAR | Status: DC | PRN
  Administered 2012-12-30: 150 ug via INTRAVENOUS
  Administered 2012-12-30: 50 ug via INTRAVENOUS
  Administered 2012-12-30: 100 ug via INTRAVENOUS

## 2012-12-30 MED ORDER — OXYCODONE HCL 5 MG PO TABS: 5.0000 mg | ORAL_TABLET | Freq: Once | ORAL | Status: DC | PRN

## 2012-12-30 MED ORDER — LACTATED RINGERS IV SOLN
INTRAVENOUS | Status: DC
  Administered 2012-12-30 (×2): via INTRAVENOUS

## 2012-12-30 MED ORDER — ACETAMINOPHEN 650 MG RE SUPP: 650.0000 mg | Freq: Four times a day (QID) | RECTAL | Status: DC | PRN

## 2012-12-30 MED ORDER — BUPIVACAINE-EPINEPHRINE PF 0.25-1:200000 % IJ SOLN
INTRAMUSCULAR | Status: AC
  Filled 2012-12-30: qty 30

## 2012-12-30 MED ORDER — PAROXETINE HCL 20 MG PO TABS
20.0000 mg | ORAL_TABLET | Freq: Every day | ORAL | Status: DC
  Administered 2012-12-31 – 2013-01-01 (×2): 20 mg via ORAL
  Filled 2012-12-30 (×3): qty 1

## 2012-12-30 MED ORDER — METHOCARBAMOL 500 MG PO TABS
500.0000 mg | ORAL_TABLET | Freq: Four times a day (QID) | ORAL | Status: DC | PRN
  Administered 2012-12-30: 500 mg via ORAL
  Filled 2012-12-30: qty 1

## 2012-12-30 MED ORDER — LORATADINE 10 MG PO TABS
10.0000 mg | ORAL_TABLET | Freq: Every day | ORAL | Status: DC
  Administered 2012-12-31 – 2013-01-01 (×2): 10 mg via ORAL
  Filled 2012-12-30 (×2): qty 1

## 2012-12-30 MED ORDER — PHENYLEPHRINE HCL 10 MG/ML IJ SOLN
INTRAMUSCULAR | Status: DC | PRN
  Administered 2012-12-30 (×4): 80 ug via INTRAVENOUS

## 2012-12-30 MED ORDER — METOCLOPRAMIDE HCL 10 MG PO TABS: 5.0000 mg | ORAL_TABLET | Freq: Three times a day (TID) | ORAL | Status: DC | PRN

## 2012-12-30 MED ORDER — ONDANSETRON HCL 4 MG/2ML IJ SOLN: 4.0000 mg | Freq: Four times a day (QID) | INTRAMUSCULAR | Status: DC | PRN

## 2012-12-30 MED ORDER — ONDANSETRON HCL 4 MG/2ML IJ SOLN
INTRAMUSCULAR | Status: DC | PRN
  Administered 2012-12-30: 4 mg via INTRAVENOUS

## 2012-12-30 MED ORDER — OXYCODONE-ACETAMINOPHEN 5-325 MG PO TABS
ORAL_TABLET | ORAL | Status: AC
  Administered 2012-12-30: 1
  Filled 2012-12-30: qty 1

## 2012-12-30 MED ORDER — ROCURONIUM BROMIDE 100 MG/10ML IV SOLN
INTRAVENOUS | Status: DC | PRN
  Administered 2012-12-30: 10 mg via INTRAVENOUS
  Administered 2012-12-30: 50 mg via INTRAVENOUS
  Administered 2012-12-30 (×2): 20 mg via INTRAVENOUS

## 2012-12-30 MED ORDER — MIDAZOLAM HCL 2 MG/2ML IJ SOLN: 1.0000 mg | INTRAMUSCULAR | Status: DC | PRN

## 2012-12-30 MED ORDER — FENTANYL CITRATE 0.05 MG/ML IJ SOLN: 50.0000 ug | Freq: Once | INTRAMUSCULAR | Status: DC

## 2012-12-30 MED ORDER — HYDROMORPHONE HCL PF 1 MG/ML IJ SOLN
0.5000 mg | INTRAMUSCULAR | Status: DC | PRN
  Administered 2012-12-30 – 2012-12-31 (×4): 1 mg via INTRAVENOUS
  Filled 2012-12-30 (×4): qty 1

## 2012-12-30 MED ORDER — DOCUSATE SODIUM 100 MG PO CAPS
100.0000 mg | ORAL_CAPSULE | Freq: Two times a day (BID) | ORAL | Status: DC
  Administered 2012-12-30 – 2013-01-01 (×5): 100 mg via ORAL
  Filled 2012-12-30 (×5): qty 1

## 2012-12-30 MED ORDER — PROMETHAZINE HCL 25 MG/ML IJ SOLN: 6.2500 mg | INTRAMUSCULAR | Status: DC | PRN

## 2012-12-30 MED ORDER — PSEUDOEPHEDRINE HCL ER 120 MG PO TB12
120.0000 mg | ORAL_TABLET | Freq: Two times a day (BID) | ORAL | Status: DC
  Administered 2012-12-30 – 2013-01-01 (×4): 120 mg via ORAL
  Filled 2012-12-30 (×5): qty 1

## 2012-12-30 MED ORDER — MENTHOL 3 MG MT LOZG: 1.0000 | LOZENGE | OROMUCOSAL | Status: DC | PRN

## 2012-12-30 MED ORDER — LIDOCAINE HCL (CARDIAC) 20 MG/ML IV SOLN
INTRAVENOUS | Status: DC | PRN
  Administered 2012-12-30: 100 mg via INTRAVENOUS

## 2012-12-30 MED ORDER — PNEUMOCOCCAL VAC POLYVALENT 25 MCG/0.5ML IJ INJ
0.5000 mL | INJECTION | INTRAMUSCULAR | Status: AC
  Administered 2012-12-31: 0.5 mL via INTRAMUSCULAR
  Filled 2012-12-30: qty 0.5

## 2012-12-30 MED ORDER — GLYCOPYRROLATE 0.2 MG/ML IJ SOLN
INTRAMUSCULAR | Status: DC | PRN
  Administered 2012-12-30: .4 mg via INTRAVENOUS

## 2012-12-30 MED ORDER — BUPIVACAINE-EPINEPHRINE 0.25% -1:200000 IJ SOLN
INTRAMUSCULAR | Status: DC | PRN
  Administered 2012-12-30: 8 mL

## 2012-12-30 MED ORDER — CHLORHEXIDINE GLUCONATE 4 % EX LIQD: 60.0000 mL | Freq: Once | CUTANEOUS | Status: DC

## 2012-12-30 SURGICAL SUPPLY — 68 items
BIT DRILL 2.0 LNG QUCK RELEASE (BIT) ×1 IMPLANT
BIT DRILL 2.8X5 QR DISP (BIT) ×2 IMPLANT
BONE CHIP PRESERV 20CC (Bone Implant) ×2 IMPLANT
BRUSH SCRUB DISP (MISCELLANEOUS) ×4 IMPLANT
CANISTER SUCTION 2500CC (MISCELLANEOUS) ×2 IMPLANT
CLOTH BEACON ORANGE TIMEOUT ST (SAFETY) ×2 IMPLANT
COVER SURGICAL LIGHT HANDLE (MISCELLANEOUS) ×2 IMPLANT
DRAPE C-ARM 42X72 X-RAY (DRAPES) ×2 IMPLANT
DRAPE C-ARMOR (DRAPES) ×2 IMPLANT
DRAPE INCISE IOBAN 66X45 STRL (DRAPES) ×2 IMPLANT
DRAPE ORTHO SPLIT 77X108 STRL (DRAPES) ×2
DRAPE SURG ORHT 6 SPLT 77X108 (DRAPES) ×2 IMPLANT
DRAPE U-SHAPE 47X51 STRL (DRAPES) ×2 IMPLANT
DRILL 2.0 LNG QUICK RELEASE (BIT) ×2
DRSG EMULSION OIL 3X3 NADH (GAUZE/BANDAGES/DRESSINGS) IMPLANT
DRSG MEPILEX BORDER 4X8 (GAUZE/BANDAGES/DRESSINGS) ×2 IMPLANT
ELECT NEEDLE TIP 2.8 STRL (NEEDLE) ×2 IMPLANT
ELECT REM PT RETURN 9FT ADLT (ELECTROSURGICAL) ×2
ELECTRODE REM PT RTRN 9FT ADLT (ELECTROSURGICAL) ×1 IMPLANT
GLOVE BIO SURGEON STRL SZ7.5 (GLOVE) ×4 IMPLANT
GLOVE BIO SURGEON STRL SZ8 (GLOVE) ×4 IMPLANT
GLOVE BIOGEL PI IND STRL 7.5 (GLOVE) ×1 IMPLANT
GLOVE BIOGEL PI IND STRL 8 (GLOVE) ×2 IMPLANT
GLOVE BIOGEL PI INDICATOR 7.5 (GLOVE) ×1
GLOVE BIOGEL PI INDICATOR 8 (GLOVE) ×2
GLOVE ORTHOPEDIC STR SZ6.5 (GLOVE) ×2 IMPLANT
GLOVE SURG SS PI 8.0 STRL IVOR (GLOVE) ×2 IMPLANT
GOWN PREVENTION PLUS XLARGE (GOWN DISPOSABLE) IMPLANT
GOWN STRL NON-REIN LRG LVL3 (GOWN DISPOSABLE) IMPLANT
KIT BASIN OR (CUSTOM PROCEDURE TRAY) ×2 IMPLANT
KIT INFUSE MEDIUM (Orthopedic Implant) ×2 IMPLANT
KIT ROOM TURNOVER OR (KITS) ×2 IMPLANT
MANIFOLD NEPTUNE II (INSTRUMENTS) IMPLANT
NEEDLE HYPO 25GX1X1/2 BEV (NEEDLE) ×2 IMPLANT
NS IRRIG 1000ML POUR BTL (IV SOLUTION) ×2 IMPLANT
PACK TOTAL JOINT (CUSTOM PROCEDURE TRAY) ×2 IMPLANT
PAD ARMBOARD 7.5X6 YLW CONV (MISCELLANEOUS) ×4 IMPLANT
PLATE DIST CLAV 2.3 16H RT (Plate) ×2 IMPLANT
SCREW BN FT 16X2.3XLCK HEX CRT (Screw) ×1 IMPLANT
SCREW CORT FT 18X2.3XLCK HEX (Screw) ×3 IMPLANT
SCREW CORT FT 22X2.3XLCK HEX (Screw) ×2 IMPLANT
SCREW CORTICAL 3.5X20MM (Screw) ×2 IMPLANT
SCREW CORTICAL LOCKING 2.3X16M (Screw) ×1 IMPLANT
SCREW CORTICAL LOCKING 2.3X18M (Screw) ×3 IMPLANT
SCREW CORTICAL LOCKING 2.3X22M (Screw) ×2 IMPLANT
SCREW HEXALOBE NON-LOCK 3.5X16 (Screw) ×2 IMPLANT
SCREW LOCK 18X3.5X HEXALOBE (Screw) ×1 IMPLANT
SCREW LOCKING 3.5X18MM (Screw) ×1 IMPLANT
SCREW NON LOCKING HEX 3.5X18MM (Screw) ×4 IMPLANT
SCREW NON TOGG 2.3X18MM (Screw) ×2 IMPLANT
SLING ARM FOAM STRAP LRG (SOFTGOODS) ×2 IMPLANT
SPONGE GAUZE 4X4 12PLY (GAUZE/BANDAGES/DRESSINGS) IMPLANT
SPONGE LAP 18X18 X RAY DECT (DISPOSABLE) ×4 IMPLANT
STRIP CLOSURE SKIN 1/2X4 (GAUZE/BANDAGES/DRESSINGS) IMPLANT
SUCTION FRAZIER TIP 10 FR DISP (SUCTIONS) ×2 IMPLANT
SUT ETHILON 3 0 PS 1 (SUTURE) ×2 IMPLANT
SUT MNCRL AB 4-0 PS2 18 (SUTURE) IMPLANT
SUT PROLENE 3 0 PS 1 (SUTURE) IMPLANT
SUT VIC AB 0 CT1 27 (SUTURE) ×1
SUT VIC AB 0 CT1 27XBRD ANBCTR (SUTURE) ×1 IMPLANT
SUT VIC AB 2-0 CT1 27 (SUTURE) ×1
SUT VIC AB 2-0 CT1 TAPERPNT 27 (SUTURE) ×1 IMPLANT
SUT VIC AB 2-0 CT3 27 (SUTURE) IMPLANT
SYR CONTROL 10ML LL (SYRINGE) IMPLANT
TUBE ANAEROBIC SPECIMEN COL (MISCELLANEOUS) ×2 IMPLANT
WATER STERILE IRR 1000ML POUR (IV SOLUTION) IMPLANT
WIRE TACK PLATE PL-PTACK (WIRE) ×4 IMPLANT
YANKAUER SUCT BULB TIP NO VENT (SUCTIONS) ×2 IMPLANT

## 2012-12-30 NOTE — Preoperative (Signed)
Beta Blockers   Reason not to administer Beta Blockers:Not Applicable 

## 2012-12-30 NOTE — Transfer of Care (Signed)
Immediate Anesthesia Transfer of Care Note  Patient: [name] [name]  Procedure(s) Performed: Procedure(s): RIGHT CLAVICLE NON UNION REPAIR SUBACROMICAL INJECTION  (Right)  Patient Location: PACU  Anesthesia Type:General  Level of Consciousness: awake  Airway & Oxygen Therapy: Patient Spontanous Breathing and Patient connected to nasal cannula oxygen  Post-op Assessment: Report given to PACU RN, Post -op Vital signs reviewed and stable and Patient moving all extremities X 4  Post vital signs: Reviewed and stable  Complications: No apparent anesthesia complications
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
## 2012-12-30 NOTE — Anesthesia Preprocedure Evaluation (Signed)
Anesthesia Evaluation  Patient identified by MRN, date of birth, ID band Patient awake    Reviewed: Allergy & Precautions, H&P , NPO status , Patient's Chart, lab work & pertinent test results  Airway Mallampati: II TM Distance: >3 FB Neck ROM: Full    Dental   Pulmonary shortness of breath, COPD oxygen dependent, former smoker,  + rhonchi   + decreased breath sounds      Cardiovascular Rhythm:Regular Rate:Normal     Neuro/Psych Depression    GI/Hepatic   Endo/Other    Renal/GU      Musculoskeletal   Abdominal   Peds  Hematology   Anesthesia Other Findings   Reproductive/Obstetrics                           Anesthesia Physical Anesthesia Plan  ASA: III  Anesthesia Plan: General   Post-op Pain Management:    Induction: Intravenous  Airway Management Planned: Oral ETT  Additional Equipment:   Intra-op Plan:   Post-operative Plan: Extubation in OR  Informed Consent: I have reviewed the patients History and Physical, chart, labs and discussed the procedure including the risks, benefits and alternatives for the proposed anesthesia with the patient or authorized representative who has indicated his/her understanding and acceptance.     Plan Discussed with: CRNA and Surgeon  Anesthesia Plan Comments:         Anesthesia Quick Evaluation

## 2012-12-30 NOTE — H&P (Signed)
I have seen and examined the patient. I agree with the findings above.  I discussed with the patient the risks and benefits of surgery, including the possibility of infection, nerve injury, vessel injury, wound breakdown, arthritis, symptomatic hardware, DVT/ PE, loss of motion, persistent nonunion, and need for further surgery among others.  He understood these risks and wished to proceed.   Budd Palmer, MD 12/30/2012 7:59 AM

## 2012-12-30 NOTE — Anesthesia Postprocedure Evaluation (Signed)
  Anesthesia Post-op Note  Patient: [name] [name]  Procedure(s) Performed: Procedure(s): RIGHT CLAVICLE NON UNION REPAIR SUBACROMICAL INJECTION  (Right)  Patient Location: PACU  Anesthesia Type:General  Level of Consciousness: awake and alert   Airway and Oxygen Therapy: Patient Spontanous Breathing  Post-op Pain: mild  Post-op Assessment: Post-op Vital signs reviewed, Patient's Cardiovascular Status Stable, Respiratory Function Stable, Patent Airway, No signs of Nausea or vomiting and Pain level controlled  Post-op Vital Signs: stable  Complications: No apparent anesthesia complications

## 2012-12-31 LAB — GLUCOSE, CAPILLARY
Glucose-Capillary: 107 mg/dL — ABNORMAL HIGH (ref 70–99)
Glucose-Capillary: 113 mg/dL — ABNORMAL HIGH (ref 70–99)

## 2012-12-31 MED ORDER — METHOCARBAMOL 500 MG PO TABS
1000.0000 mg | ORAL_TABLET | Freq: Four times a day (QID) | ORAL | Status: DC
  Administered 2012-12-31 – 2013-01-01 (×4): 1000 mg via ORAL
  Filled 2012-12-31: qty 1
  Filled 2012-12-31 (×6): qty 2

## 2012-12-31 MED ORDER — HYDROMORPHONE HCL PF 1 MG/ML IJ SOLN
1.0000 mg | INTRAMUSCULAR | Status: DC | PRN
  Administered 2013-01-01: 1 mg via INTRAVENOUS
  Filled 2012-12-31: qty 1

## 2012-12-31 MED ORDER — OXYCODONE HCL 5 MG PO TABS: 5.0000 mg | ORAL_TABLET | Freq: Four times a day (QID) | ORAL | Status: DC | PRN

## 2012-12-31 MED ORDER — OXYCODONE-ACETAMINOPHEN 5-325 MG PO TABS
1.0000 | ORAL_TABLET | Freq: Four times a day (QID) | ORAL | Status: DC | PRN
  Administered 2012-12-31 – 2013-01-01 (×2): 2 via ORAL
  Filled 2012-12-31 (×3): qty 2

## 2012-12-31 MED ORDER — OXYCODONE HCL 5 MG PO TABS
5.0000 mg | ORAL_TABLET | ORAL | Status: DC | PRN
  Administered 2012-12-31: 15 mg via ORAL
  Administered 2013-01-01: 10 mg via ORAL
  Filled 2012-12-31 (×2): qty 3

## 2012-12-31 NOTE — Progress Notes (Signed)
[facility] Progress Note     1 Day Post-Op  Subjective   Doing ok but having moderate pain, states pain is 8/10  Wants to stay another night to optimize pain control and work with PT/OT Tolerating breakfast + void this am    Objective  BP 110/78  Pulse 85  Temp(Src) 98.3 F (36.8 C) (Oral)  Resp 16  SpO2 95%  Patient Vitals for the past 24 hrs:  BP Temp Temp src Pulse Resp SpO2  12/31/12 0615 110/78 mmHg 98.3 F (36.8 C) Oral 85 16 95 %  12/30/12 2149 99/71 mmHg 98.8 F (37.1 C) Oral 89 16 95 %  12/30/12 1330 133/93 mmHg 97.8 F (36.6 C) Oral 85 16 93 %  12/30/12 1315 - 98 F (36.7 C) - 82 11 95 %  12/30/12 1300 125/86 mmHg - - 83 12 91 %  12/30/12 1230 146/85 mmHg - - 84 17 95 %  12/30/12 1210 - - - 82 15 93 %  12/30/12 1200 138/83 mmHg 97.2 F (36.2 C) - 82 15 96 %  12/30/12 1155 - - - 82 12 95 %  12/30/12 1145 133/87 mmHg - - 85 17 95 %  12/30/12 1130 146/82 mmHg - - 85 12 95 %  12/30/12 1115 146/87 mmHg - - 85 12 97 %  12/30/12 1100 143/82 mmHg - - 86 13 98 %  12/30/12 1058 - 97.8 F (36.6 C) - - - -    Intake/Output     08/01 0701 - 08/02 0700 08/02 0701 - 08/03 0700   I.V. 1500    Total Intake 1500     Urine 800    Blood 25    Total Output 825     Net +675            Labs No new labs this am  Exam  Gen: awake and alert, NAD Lungs: clear Cardiac: reg Abd: + BS, NT Ext:      Right Upper Extremity  Dressing intact  R/U/M/Ax motor and sensory functions intact  Ext warm  + Radial pulse  Swelling stable  Good ROM elbow, forearm, wrist and hand         Assessment and Plan  1 Day Post-Op  72 y/o male s/p repair of R clavicle nonunion   1. R clavicle nonunion repair pod 1  ROM as pain allows  Sling for comfort  PT/OT  Ice and elevate  No lifting with R arm  2. COPD  Nebs as needed 3. Pain  Adjust pain meds today   4. FEN  Advance diet as tolerated  NSL IV  5. DVT/PE prophylaxis  No pharmacologics as pt  mobile   6. Dispo:  PT/OT evals  D/c home tomorrow   Mearl Latin, PA-C [facility] [phone] (P) 12/31/2012 8:51 AM

## 2012-12-31 NOTE — Progress Notes (Signed)
OT EVALUATION  Completed all education regarding precautions regarding ORIF R clavicle. No pushing pulling or lifting > 5 lbs. Sling for comfort. Pt demostrated understanding. Pt ambulating RA and desat to 76. Applied 3L O2 and rebound to 96 in @ 1 min. Pt talking RA and desat to 91. Discussed concerns with nsg. Pthas hx of COPD and has O2 set up at home. Rec for nsg to walk with pt frequently and check SATS. Pt will need to ambulate on at lest 2L. Spot check pt when on RA. Educated pt on importance of using incentive spirometer. Pt verbalized understanding. Pt should be ready for D/C tomorrow if respiratory status OK. No further OR needed.  States pain is limiting at this time. Repositioned Ice pack to R shoulder. nsg aware of pain.  12/31/12 0800  OT Visit Information  Last OT Received On 12/31/12  Assistance Needed +1  History of Present Illness s/p ORIF R clavicle fracture. hx of COPD  Precautions  Precautions Other (comment) (no lifting pushing pulling RUE > 5 ils)  Required Braces or Orthoses Sling (PRN)  Restrictions  Weight Bearing Restrictions Yes  RUE Weight Bearing NWB  Home Living  Family/patient expects to be discharged to: Private residence  Living Arrangements Spouse/significant other  Available Help at Discharge Available 24 hours/day  Type of Home House  Home Access Stairs to enter  Entrance Stairs-Number of Steps 3  Entrance Stairs-Rails Can reach both  Home Layout One level  Catering manager Handicapped height  Bathroom Accessibility Yes  How Accessible Accessible via walker  Prior Function  Level of Independence Independent  Communication  Communication No difficulties  Cognition  Arousal/Alertness Awake/alert  Behavior During Therapy WFL for tasks assessed/performed  Overall Cognitive Status Within Functional Limits for tasks assessed  Upper Extremity Assessment  Upper Extremity Assessment RUE deficits/detail  RUE  Deficits / Details pain. ROM within papin tolerance  RUE Coordination decreased gross motor  Lower Extremity Assessment  Lower Extremity Assessment Overall WFL for tasks assessed  Cervical / Trunk Assessment  Cervical / Trunk Assessment Normal  ADL  ADL Comments Pt overall mod I with all ADL and mobility  Bed Mobility  Bed Mobility Supine to Sit  Supine to Sit 5: Supervision (vc to not push with RUE)  Transfers  Transfers Sit to Stand;Stand to Sit  Sit to Stand 5: Supervision (vc to not push wit RUWE)  Stand to Sit 6: Modified independent (Device/Increase time)  Balance  Balance Assessed (WFL for ADL)  Exercises  Exercises Other exercises  Other Exercises  Other Exercises RUE A/AAROM within pain tolerance  Other Exercises sling for comfort only  Other Exercises no pulling pushing or lifting with RUE >5 ils  OT - End of Session  Equipment Utilized During Treatment Oxygen  Activity Tolerance Patient tolerated treatment well  Patient left in chair;with call bell/phone within reach  Nurse Communication Mobility status;Other (comment) (need for O2)  OT Assessment  OT Recommendation/Assessment Patient does not need any further OT services  OT Recommendation  Follow Up Recommendations No OT follow up  OT Equipment None recommended by OT  Acute Rehab OT Goals  Patient Stated Goal go home  OT Goal Formulation (eval only)  OT Time Calculation  OT Start Time 0825  OT Stop Time 0910  OT Time Calculation (min) 45 min  OT G-codes **NOT FOR INPATIENT CLASS**  Functional Assessment Tool Used clinical judgement  Functional Limitation Self care  Self Care Current Status (  B2841) CI  Self Care Goal Status (L2440) [facility]  Self Care Discharge Status [phone]) [facility]  OT General Charges  $OT Visit 1 Procedure  OT Evaluation  $Initial OT Evaluation Tier I 1 Procedure  OT Treatments  $Self Care/Home Management  23-37 mins  $Therapeutic Activity 8-22 mins  [facility], OTR/L   [phone] 12/31/2012

## 2013-01-01 LAB — GLUCOSE, CAPILLARY: Glucose-Capillary: 109 mg/dL — ABNORMAL HIGH (ref 70–99)

## 2013-01-01 LAB — WOUND CULTURE: Culture: NO GROWTH

## 2013-01-01 MED ORDER — OXYCODONE-ACETAMINOPHEN 10-325 MG PO TABS: 1.0000 | ORAL_TABLET | Freq: Four times a day (QID) | ORAL | Status: DC | PRN

## 2013-01-01 MED ORDER — ALBUTEROL SULFATE (5 MG/ML) 0.5% IN NEBU: 2.5000 mg | INHALATION_SOLUTION | Freq: Two times a day (BID) | RESPIRATORY_TRACT | Status: DC

## 2013-01-01 MED ORDER — OXYCODONE HCL 5 MG PO TABS: 5.0000 mg | ORAL_TABLET | ORAL | Status: DC | PRN

## 2013-01-01 MED ORDER — METHOCARBAMOL 500 MG PO TABS: 500.0000 mg | ORAL_TABLET | Freq: Four times a day (QID) | ORAL | Status: DC | PRN
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
## 2013-01-01 NOTE — Progress Notes (Signed)
Orthopaedic Trauma Service Progress Note     2 Days Post-Op  Subjective   Doing better this am Pain decreasing Worked well with OT yesterday  Ready to go home  No new issues   Review of Systems  Constitutional: Negative for fever.  Eyes: Negative for blurred vision.  Respiratory: Negative for shortness of breath.   Cardiovascular: Negative for chest pain.  Gastrointestinal: Negative for nausea, vomiting and abdominal pain.  Neurological: Negative for tingling, sensory change and headaches.     Objective  BP 144/71  Pulse 81  Temp(Src) 97.5 F (36.4 C) (Oral)  Resp 16  SpO2 95%  Patient Vitals for the past 24 hrs:  BP Temp Pulse Resp SpO2  01/01/13 0639 144/71 mmHg 97.5 F (36.4 C) 81 16 95 %  12/31/12 2153 118/56 mmHg 98.2 F (36.8 C) 82 16 98 %  12/31/12 1944 - - - - 90 %  12/31/12 1500 130/74 mmHg 98.3 F (36.8 C) 83 16 91 %    Intake/Output     08/02 0701 - 08/03 0700 08/03 0701 - 08/04 0700   P.O. 480 240   I.V.     Total Intake 480 240   Urine     Blood     Total Output       Net +480 +240        Urine Occurrence 4 x    Stool Occurrence 1 x      Labs No new labs  Exam  Gen: awake and alert, NAD Lungs: clear anterior fields Cardiac: s1 and s2 Abd: soft NT, +BS Ext:      Right Upper Extremity             incision pristine  No signs of infection              R/U/M/Ax motor and sensory functions intact             Ext warm             + Radial pulse             Swelling stable             Good ROM elbow, forearm, wrist and hand                Assessment and Plan  2 Days Post-Op   72 y/o male s/p repair of R clavicle nonunion   1. R clavicle nonunion repair pod 2             ROM as pain allows             Sling for comfort             PT/OT             Ice and elevate             No lifting with R arm  Dressing changes as needed   Ok to shower   2. COPD             Nebs as needed 3. Pain           percocet  10/325  Oxy IR 5-10 mg for breakthrough   Robaxin for spasms              4. FEN            reg diet  Dc IV  5. DVT/PE prophylaxis  No pharmacologics as pt mobile   6. Dispo:           dc home today  Follow up with ortho in 10 days   Mearl Latin, PA-C [facility] [phone] (P) 01/01/2013 9:28 AM

## 2013-01-03 ENCOUNTER — Encounter (HOSPITAL_COMMUNITY): Payer: Self-pay | Admitting: Orthopedic Surgery

## 2013-01-04 LAB — ANAEROBIC CULTURE

## 2013-05-15 ENCOUNTER — Ambulatory Visit
Admission: RE | Admit: 2013-05-15 | Discharge: 2013-05-15 | Disposition: A | Discharge status: Home / Self Care | Payer: Medicare Other | Source: Ambulatory Visit | Attending: Orthopedic Surgery | Admitting: Orthopedic Surgery

## 2013-05-15 ENCOUNTER — Other Ambulatory Visit: Payer: Self-pay | Admitting: Orthopedic Surgery

## 2013-05-15 DIAGNOSIS — M25511 Pain in right shoulder: Secondary | ICD-10-CM

## 2013-06-26 ENCOUNTER — Encounter (HOSPITAL_COMMUNITY): Payer: Self-pay | Admitting: Pharmacy Technician

## 2013-07-01 NOTE — Pre-Procedure Instructions (Signed)
[name] [name]  07/01/2013   Your procedure is scheduled on:  February 3  Report to [facility] Entrance "A" [address] at 06:00 AM.  Call this number if you have problems the morning of surgery: [phone]   Remember:   Do not eat food or drink liquids after midnight.   Take these medicines the morning of surgery with A SIP OF WATER: Albuterol (if needed), Allegra D (if needed), Percocet, Paxil     Do not wear jewelry, make-up or nail polish.  Do not wear lotions, powders, or perfumes. You may wear deodorant.  Do not shave 48 hours prior to surgery. Men may shave face and neck.  Do not bring valuables to the hospital.  [facility] is not responsible                  for any belongings or valuables.               Contacts, dentures or bridgework may not be worn into surgery.  Leave suitcase in the car. After surgery it may be brought to your room.  For patients admitted to the hospital, discharge time is determined by your                treatment team.               Patients discharged the day of surgery will not be allowed to drive  home.  Name and phone number of your driver: Family/ Friend    Please read over the following fact sheets that you were given: Pain Booklet, Coughing and Deep Breathing and Surgical Site Infection Prevention     [place] - Preparing for Surgery  Before surgery, you can play an important role.  Because skin is not sterile, your skin needs to be as free of germs as possible.  You can reduce the number of germs on you skin by washing with CHG (chlorahexidine gluconate) soap before surgery.  CHG is an antiseptic cleaner which kills germs and bonds with the skin to continue killing germs even after washing.  Please DO NOT use if you have an allergy to CHG or antibacterial soaps.  If your skin becomes reddened/irritated stop using the CHG and inform your nurse when you arrive at Short Stay.  Do not shave (including  legs and underarms) for at least 48 hours prior to the first CHG shower.  You may shave your face.  Please follow these instructions carefully:   1.  Shower with CHG Soap the night before surgery and the morning of Surgery.  2.  If you choose to wash your hair, wash your hair first as usual with your normal shampoo.  3.  After you shampoo, rinse your hair and body thoroughly to remove the Shampoo.  4.  Use CHG as you would any other liquid soap.  You can apply chg directly to the skin and wash gently with scrungie or a clean washcloth.  5.  Apply the CHG Soap to your body ONLY FROM THE NECK DOWN.  Do not use on open wounds or open sores. Avoid contact with your eyes, ears, mouth and genitals (private parts).  Wash genitals (private parts) with your normal soap.  6.  Wash thoroughly, paying special attention to the area where your surgery will be performed.  7.  Thoroughly rinse your body with warm water from the neck down.  8.  DO NOT shower/wash with your normal  soap after using and rinsing off the CHG Soap.  9.  Pat yourself dry with a clean towel.            10.  Wear clean pajamas.            11.  Place clean sheets on your bed the night of your first shower and do not sleep with pets.  Day of Surgery  Do not apply any lotions/deoderants the morning of surgery.  Please wear clean clothes to the hospital/surgery center.

## 2013-07-03 ENCOUNTER — Encounter (HOSPITAL_COMMUNITY)
Admission: RE | Admit: 2013-07-03 | Discharge: 2013-07-03 | Disposition: A | Discharge status: Home / Self Care | Payer: Medicare Other | Source: Ambulatory Visit | Attending: Orthopedic Surgery | Admitting: Orthopedic Surgery

## 2013-07-03 ENCOUNTER — Encounter (HOSPITAL_COMMUNITY): Payer: Self-pay

## 2013-07-03 HISTORY — DX: Post-traumatic stress disorder, unspecified: F43.10

## 2013-07-03 LAB — COMPREHENSIVE METABOLIC PANEL
ALT: 11 U/L (ref 0–53)
AST: 17 U/L (ref 0–37)
Albumin: 4.2 g/dL (ref 3.5–5.2)
Alkaline Phosphatase: 73 U/L (ref 39–117)
BUN: 22 mg/dL (ref 6–23)
CO2: 28 meq/L (ref 19–32)
Calcium: 9.1 mg/dL (ref 8.4–10.5)
Chloride: 104 mEq/L (ref 96–112)
Creatinine, Ser: 0.94 mg/dL (ref 0.50–1.35)
GFR, EST NON AFRICAN AMERICAN: 82 mL/min — AB (ref >90)
Glucose, Bld: 108 mg/dL — ABNORMAL HIGH (ref 70–99)
Potassium: 4.8 mEq/L (ref 3.7–5.3)
Sodium: 144 mEq/L (ref 137–147)
Total Bilirubin: 0.3 mg/dL (ref 0.3–1.2)
Total Protein: 7.2 g/dL (ref 6.0–8.3)

## 2013-07-03 LAB — CBC WITH DIFFERENTIAL/PLATELET
Basophils Absolute: 0 10*3/uL (ref 0.0–0.1)
Basophils Relative: 1 % (ref 0–1)
EOS PCT: 2 % (ref 0–5)
Eosinophils Absolute: 0.2 10*3/uL (ref 0.0–0.7)
HEMATOCRIT: 43.6 % (ref 39.0–52.0)
Hemoglobin: 14.7 g/dL (ref 13.0–17.0)
LYMPHS PCT: 25 % (ref 12–46)
Lymphs Abs: 1.7 10*3/uL (ref 0.7–4.0)
MCH: 30.9 pg (ref 26.0–34.0)
MCHC: 33.7 g/dL (ref 30.0–36.0)
MCV: 91.8 fL (ref 78.0–100.0)
Monocytes Absolute: 0.5 10*3/uL (ref 0.1–1.0)
Monocytes Relative: 7 % (ref 3–12)
Neutro Abs: 4.6 10*3/uL (ref 1.7–7.7)
Neutrophils Relative %: 66 % (ref 43–77)
Platelets: 176 10*3/uL (ref 150–400)
RBC: 4.75 MIL/uL (ref 4.22–5.81)
RDW: 13.9 % (ref 11.5–15.5)
WBC: 7 10*3/uL (ref 4.0–10.5)

## 2013-07-03 LAB — PROTIME-INR
INR: 0.97 (ref 0.00–1.49)
Prothrombin Time: 12.7 seconds (ref 11.6–15.2)

## 2013-07-03 MED ORDER — CEFAZOLIN SODIUM-DEXTROSE 2-3 GM-% IV SOLR
2.0000 g | INTRAVENOUS | Status: AC
  Administered 2013-07-04: 2 g via INTRAVENOUS
  Filled 2013-07-03: qty 50

## 2013-07-03 MED ORDER — LACTATED RINGERS IV SOLN
INTRAVENOUS | Status: DC
  Administered 2013-07-04: 50 mL/h via INTRAVENOUS

## 2013-07-03 MED ORDER — ACETAMINOPHEN 500 MG PO TABS
1000.0000 mg | ORAL_TABLET | Freq: Once | ORAL | Status: AC
  Administered 2013-07-04: 500 mg via ORAL
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
## 2013-07-03 NOTE — H&P (Signed)
[facility] H&P  Chief Complaint:  R shoulder pain, loose hardware HPI:       73 y/o male well known to OTS for multiple procedures to R clavicle. Presents today with continued R shoulder pain and loose clavicle plate.  pts prolonged healing process likely due to subclinical chronic infection. Most recent labs were within acceptable limits with CRP of 4.0 and ESR of 2.  Pt presents today for [facility] R clavicle, R shoulder arthroscopy and SAD  Past Medical History  Diagnosis Date  . Shortness of breath     with exertion   . Arthritis     knees and back   . Depression     ptsd   . Pneumonia   . On supplemental oxygen therapy     2 liters if needed  . COPD (chronic obstructive pulmonary disease)     dr Emi Holes    [place]  . PTSD (post-traumatic stress disorder)     pt was a POW x 12yr    Past Surgical History  Procedure Laterality Date  . Other surgical history      cyst removed under left ear  and under right arm   . Appendectomy    . Other surgical history      left leg surgery   . Other surgical history      right leg surgery   . Other surgical history      right facial surgery   . Hemorrhoid surgery    . Other surgical history      nasal surgery   . Pilonidal cyst / sinus excision    . Other surgical history      left foot surgey and left great toe surgery   . Other surgical history      right collar bone surgery   . Carpal tunnel release      right   . Cervical fusion    . Subdural hematoma evacuation via craniotomy    . Total knee arthroplasty  10/19/2011    Procedure: TOTAL KNEE ARTHROPLASTY;  Surgeon: FGearlean Alf MD;  Location: [facility];  Service: Orthopedics;  Laterality: Left;  . Joint replacement  2013    left knee replaced .  .Marland KitchenOrif clavicular fracture  02/16/2012    Procedure: OPEN REDUCTION INTERNAL FIXATION (ORIF) CLAVICULAR FRACTURE;  Surgeon: MRozanna Box MD;  [facility]  Service: Orthopedics;  Laterality: Right;  Removal of  hardware, ORIF of Clavicle, and allograft bone harvesting.   .Juanda Chancebone graft  02/16/2012    Procedure: HARVEST ILIAC BONE GRAFT;  Surgeon: MRozanna Box MD;  [place] [facility]  Service: Orthopedics;  Laterality: Right;  right iliac bone graft harvest  . Hardware removal  02/16/2012    Procedure: HARDWARE REMOVAL;  Surgeon: MRozanna Box MD;  [facility]  Service: Orthopedics;  Laterality: Right;  removal of hardware from right clavicle  . Shoulder surgery Right   . Colonoscopy    . Nose surgery    . Knee arthroscopy Bilateral   . Foot surgery Left     no "ball" on foot  . Hemorrhoid surgery      x 2  . Orif clavicular fracture Right 12/30/2012    Procedure: RIGHT CLAVICLE NON UNION REPAIR SUBACROMICAL INJECTION ;  Surgeon: MRozanna Box MD;  [facility]  Service: Orthopedics;  Laterality: Right;  . Eye surgery      No family history on file. Social History:  reports that he quit smoking about 26 years ago. He has quit using smokeless tobacco. He reports that he does not drink alcohol or use illicit drugs.  Allergies: No Known Allergies  No prescriptions prior to admission    Results for orders placed during the hospital encounter of 07/03/13 (from the past 48 hour(s))  CBC WITH DIFFERENTIAL     Status: None   Collection Time    07/03/13  9:39 AM      Result Value Range   WBC 7.0  4.0 - 10.5 K/uL   RBC 4.75  4.22 - 5.81 MIL/uL   Hemoglobin 14.7  13.0 - 17.0 g/dL   HCT 43.6  39.0 - 52.0 %   MCV 91.8  78.0 - 100.0 fL   MCH 30.9  26.0 - 34.0 pg   MCHC 33.7  30.0 - 36.0 g/dL   RDW 13.9  11.5 - 15.5 %   Platelets 176  150 - 400 K/uL   Neutrophils Relative % 66  43 - 77 %   Neutro Abs 4.6  1.7 - 7.7 K/uL   Lymphocytes Relative 25  12 - 46 %   Lymphs Abs 1.7  0.7 - 4.0 K/uL   Monocytes Relative 7  3 - 12 %   Monocytes Absolute 0.5  0.1 - 1.0 K/uL   Eosinophils Relative 2  0 - 5 %   Eosinophils Absolute 0.2  0.0 - 0.7 K/uL   Basophils Relative 1  0 - 1 %    Basophils Absolute 0.0  0.0 - 0.1 K/uL  COMPREHENSIVE METABOLIC PANEL     Status: Abnormal   Collection Time    07/03/13  9:39 AM      Result Value Range   Sodium 144  137 - 147 mEq/L   Potassium 4.8  3.7 - 5.3 mEq/L   Chloride 104  96 - 112 mEq/L   CO2 28  19 - 32 mEq/L   Glucose, Bld 108 (*) 70 - 99 mg/dL   BUN 22  6 - 23 mg/dL   Creatinine, Ser 0.94  0.50 - 1.35 mg/dL   Calcium 9.1  8.4 - 10.5 mg/dL   Total Protein 7.2  6.0 - 8.3 g/dL   Albumin 4.2  3.5 - 5.2 g/dL   AST 17  0 - 37 U/L   ALT 11  0 - 53 U/L   Alkaline Phosphatase 73  39 - 117 U/L   Total Bilirubin 0.3  0.3 - 1.2 mg/dL   GFR calc non Af Amer 82 (*) >90 mL/min   GFR calc Af Amer >90  >90 mL/min   Comment: (NOTE)     The eGFR has been calculated using the CKD EPI equation.     This calculation has not been validated in all clinical situations.     eGFR's persistently <90 mL/min signify possible Chronic Kidney     Disease.  PROTIME-INR     Status: None   Collection Time    07/03/13  9:39 AM      Result Value Range   Prothrombin Time 12.7  11.6 - 15.2 seconds   INR 0.97  0.00 - 1.49   No results found.  Review of Systems  Constitutional: Negative for fever.  Respiratory: Positive for shortness of breath.        No change from baseline   Cardiovascular: Negative for chest pain.  Gastrointestinal: Negative for nausea, vomiting and abdominal pain.  Genitourinary: Negative for dysuria.  Neurological: Negative for  tingling, sensory change and headaches.    There were no vitals taken for this visit. Physical Exam  Constitutional: He is cooperative.  Cardiovascular: Normal rate and regular rhythm.   Respiratory:  Dec at bases. No wheezes   GI:  + BS NTND  Musculoskeletal:  R upper extremity    Incision stable, no active signs of infection    Motor and sensory functions intact   + Radial pulse   Full active ROM  R shoulder    Some tenderness over AC joint   + TTP proximal biceps tendon    + pain  with resisted abduction and ER/IR  Neurological: He is alert.  Skin: Skin is warm and intact.     Assessment/Plan  73 y/o male with R shoulder pain, loose clavicle hardware and RTC impingement  OR for [facility], R shoulder arthroscopy and SAD Admit overnight for observation given baseline COPD  Jari Pigg, PA-C [facility] [phone] (P) 07/03/2013, 10:10 PM

## 2013-07-04 ENCOUNTER — Encounter (HOSPITAL_COMMUNITY): Payer: Self-pay | Admitting: *Deleted

## 2013-07-04 ENCOUNTER — Ambulatory Visit (HOSPITAL_COMMUNITY): Payer: Medicare Other | Admitting: Anesthesiology

## 2013-07-04 ENCOUNTER — Encounter (HOSPITAL_COMMUNITY)
Admission: RE | Disposition: A | Discharge status: Home / Self Care | Payer: Self-pay | Source: Ambulatory Visit | Attending: Orthopedic Surgery

## 2013-07-04 ENCOUNTER — Encounter (HOSPITAL_COMMUNITY): Payer: Medicare Other | Admitting: Anesthesiology

## 2013-07-04 ENCOUNTER — Observation Stay (HOSPITAL_COMMUNITY)
Admission: RE | Admit: 2013-07-04 | Discharge: 2013-07-06 | Disposition: A | Discharge status: Home / Self Care | Payer: Medicare Other | Source: Ambulatory Visit | Attending: Orthopedic Surgery | Admitting: Orthopedic Surgery

## 2013-07-04 DIAGNOSIS — M67919 Unspecified disorder of synovium and tendon, unspecified shoulder: Secondary | ICD-10-CM | POA: Insufficient documentation

## 2013-07-04 DIAGNOSIS — Z87891 Personal history of nicotine dependence: Secondary | ICD-10-CM | POA: Insufficient documentation

## 2013-07-04 DIAGNOSIS — M7541 Impingement syndrome of right shoulder: Secondary | ICD-10-CM | POA: Diagnosis present

## 2013-07-04 DIAGNOSIS — J4489 Other specified chronic obstructive pulmonary disease: Secondary | ICD-10-CM | POA: Insufficient documentation

## 2013-07-04 DIAGNOSIS — J449 Chronic obstructive pulmonary disease, unspecified: Secondary | ICD-10-CM | POA: Insufficient documentation

## 2013-07-04 DIAGNOSIS — Z981 Arthrodesis status: Secondary | ICD-10-CM | POA: Insufficient documentation

## 2013-07-04 DIAGNOSIS — M7580 Other shoulder lesions, unspecified shoulder: Principal | ICD-10-CM

## 2013-07-04 DIAGNOSIS — F32A Depression, unspecified: Secondary | ICD-10-CM | POA: Diagnosis present

## 2013-07-04 DIAGNOSIS — M659 Unspecified synovitis and tenosynovitis, unspecified site: Secondary | ICD-10-CM | POA: Insufficient documentation

## 2013-07-04 DIAGNOSIS — M25819 Other specified joint disorders, unspecified shoulder: Principal | ICD-10-CM | POA: Insufficient documentation

## 2013-07-04 DIAGNOSIS — IMO0002 Reserved for concepts with insufficient information to code with codable children: Secondary | ICD-10-CM | POA: Diagnosis present

## 2013-07-04 DIAGNOSIS — Z96659 Presence of unspecified artificial knee joint: Secondary | ICD-10-CM | POA: Insufficient documentation

## 2013-07-04 DIAGNOSIS — M1710 Unilateral primary osteoarthritis, unspecified knee: Secondary | ICD-10-CM | POA: Insufficient documentation

## 2013-07-04 DIAGNOSIS — Z9981 Dependence on supplemental oxygen: Secondary | ICD-10-CM | POA: Insufficient documentation

## 2013-07-04 DIAGNOSIS — M19019 Primary osteoarthritis, unspecified shoulder: Secondary | ICD-10-CM | POA: Insufficient documentation

## 2013-07-04 DIAGNOSIS — F3289 Other specified depressive episodes: Secondary | ICD-10-CM | POA: Insufficient documentation

## 2013-07-04 DIAGNOSIS — M249 Joint derangement, unspecified: Secondary | ICD-10-CM | POA: Insufficient documentation

## 2013-07-04 DIAGNOSIS — R0602 Shortness of breath: Secondary | ICD-10-CM | POA: Insufficient documentation

## 2013-07-04 DIAGNOSIS — M719 Bursopathy, unspecified: Secondary | ICD-10-CM | POA: Insufficient documentation

## 2013-07-04 DIAGNOSIS — M479 Spondylosis, unspecified: Secondary | ICD-10-CM | POA: Insufficient documentation

## 2013-07-04 DIAGNOSIS — F329 Major depressive disorder, single episode, unspecified: Secondary | ICD-10-CM | POA: Insufficient documentation

## 2013-07-04 HISTORY — DX: Primary osteoarthritis, unspecified shoulder: M19.019

## 2013-07-04 HISTORY — PX: HARDWARE REMOVAL: SHX979

## 2013-07-04 HISTORY — PX: SHOULDER ARTHROSCOPY WITH SUBACROMIAL DECOMPRESSION: SHX5684

## 2013-07-04 HISTORY — DX: Impingement syndrome of right shoulder: M75.41

## 2013-07-04 LAB — GRAM STAIN

## 2013-07-04 SURGERY — SHOULDER ARTHROSCOPY WITH SUBACROMIAL DECOMPRESSION
Anesthesia: General | Site: Shoulder | Laterality: Right

## 2013-07-04 MED ORDER — VANCOMYCIN HCL 1000 MG IV SOLR
INTRAVENOUS | Status: AC
  Filled 2013-07-04: qty 1000

## 2013-07-04 MED ORDER — ARTIFICIAL TEARS OP OINT
TOPICAL_OINTMENT | OPHTHALMIC | Status: DC | PRN
  Administered 2013-07-04: 1 via OPHTHALMIC

## 2013-07-04 MED ORDER — POLYETHYLENE GLYCOL 3350 17 G PO PACK
17.0000 g | PACK | Freq: Every day | ORAL | Status: DC | PRN
Start: 2013-07-04 — End: 2013-07-06

## 2013-07-04 MED ORDER — OXYCODONE HCL 5 MG PO TABS
5.0000 mg | ORAL_TABLET | ORAL | Status: DC | PRN
  Administered 2013-07-04 – 2013-07-06 (×4): 10 mg via ORAL
  Filled 2013-07-04 (×3): qty 2

## 2013-07-04 MED ORDER — FENTANYL CITRATE 0.05 MG/ML IJ SOLN
INTRAMUSCULAR | Status: DC | PRN
  Administered 2013-07-04: 50 ug via INTRAVENOUS
  Administered 2013-07-04 (×2): 100 ug via INTRAVENOUS
  Administered 2013-07-04: 50 ug via INTRAVENOUS

## 2013-07-04 MED ORDER — CEFAZOLIN SODIUM-DEXTROSE 2-3 GM-% IV SOLR
2.0000 g | Freq: Four times a day (QID) | INTRAVENOUS | Status: AC
  Administered 2013-07-04 – 2013-07-05 (×3): 2 g via INTRAVENOUS
  Filled 2013-07-04 (×3): qty 50

## 2013-07-04 MED ORDER — LIDOCAINE HCL (CARDIAC) 20 MG/ML IV SOLN
INTRAVENOUS | Status: AC
  Filled 2013-07-04: qty 5

## 2013-07-04 MED ORDER — ACETAMINOPHEN 500 MG PO TABS
ORAL_TABLET | ORAL | Status: AC
  Administered 2013-07-04: 500 mg via ORAL
  Filled 2013-07-04: qty 2

## 2013-07-04 MED ORDER — OXYCODONE HCL 5 MG PO TABS
ORAL_TABLET | ORAL | Status: AC
  Administered 2013-07-04: 10 mg via ORAL
  Filled 2013-07-04: qty 2

## 2013-07-04 MED ORDER — ROCURONIUM BROMIDE 50 MG/5ML IV SOLN
INTRAVENOUS | Status: AC
  Filled 2013-07-04: qty 1

## 2013-07-04 MED ORDER — GLYCOPYRROLATE 0.2 MG/ML IJ SOLN
INTRAMUSCULAR | Status: AC
  Filled 2013-07-04: qty 3

## 2013-07-04 MED ORDER — PHENYLEPHRINE 40 MCG/ML (10ML) SYRINGE FOR IV PUSH (FOR BLOOD PRESSURE SUPPORT)
PREFILLED_SYRINGE | INTRAVENOUS | Status: AC
  Filled 2013-07-04: qty 10

## 2013-07-04 MED ORDER — FEXOFENADINE-PSEUDOEPHED ER 60-120 MG PO TB12: 1.0000 | ORAL_TABLET | Freq: Two times a day (BID) | ORAL | Status: DC

## 2013-07-04 MED ORDER — EPHEDRINE SULFATE 50 MG/ML IJ SOLN
INTRAMUSCULAR | Status: DC | PRN
  Administered 2013-07-04 (×2): 15 mg via INTRAVENOUS
  Administered 2013-07-04: 10 mg via INTRAVENOUS

## 2013-07-04 MED ORDER — PROPOFOL 10 MG/ML IV BOLUS
INTRAVENOUS | Status: DC | PRN
  Administered 2013-07-04: 80 mg via INTRAVENOUS
  Administered 2013-07-04: 120 mg via INTRAVENOUS

## 2013-07-04 MED ORDER — MIDAZOLAM HCL 5 MG/5ML IJ SOLN
INTRAMUSCULAR | Status: DC | PRN
  Administered 2013-07-04 (×2): 1 mg via INTRAVENOUS

## 2013-07-04 MED ORDER — PSEUDOEPHEDRINE HCL ER 120 MG PO TB12
120.0000 mg | ORAL_TABLET | Freq: Two times a day (BID) | ORAL | Status: DC
  Administered 2013-07-04 – 2013-07-06 (×4): 120 mg via ORAL
  Filled 2013-07-04 (×5): qty 1

## 2013-07-04 MED ORDER — GENTAMICIN SULFATE 40 MG/ML IJ SOLN
INTRAMUSCULAR | Status: DC | PRN
  Administered 2013-07-04: 240 mg

## 2013-07-04 MED ORDER — 0.9 % SODIUM CHLORIDE (POUR BTL) OPTIME
TOPICAL | Status: DC | PRN
  Administered 2013-07-04: 1000 mL

## 2013-07-04 MED ORDER — ALBUTEROL SULFATE HFA 108 (90 BASE) MCG/ACT IN AERS
INHALATION_SPRAY | RESPIRATORY_TRACT | Status: DC | PRN
  Administered 2013-07-04: 2 via RESPIRATORY_TRACT

## 2013-07-04 MED ORDER — NEOSTIGMINE METHYLSULFATE 1 MG/ML IJ SOLN
INTRAMUSCULAR | Status: AC
  Filled 2013-07-04: qty 10

## 2013-07-04 MED ORDER — LORATADINE 10 MG PO TABS
10.0000 mg | ORAL_TABLET | Freq: Every day | ORAL | Status: DC
  Administered 2013-07-05: 10 mg via ORAL
  Filled 2013-07-04 (×2): qty 1

## 2013-07-04 MED ORDER — MENTHOL 3 MG MT LOZG: 1.0000 | LOZENGE | OROMUCOSAL | Status: DC | PRN

## 2013-07-04 MED ORDER — VANCOMYCIN HCL 1000 MG IV SOLR
INTRAVENOUS | Status: DC | PRN
  Administered 2013-07-04: 1000 mg

## 2013-07-04 MED ORDER — OXYCODONE-ACETAMINOPHEN 10-325 MG PO TABS: 1.0000 | ORAL_TABLET | Freq: Four times a day (QID) | ORAL | Status: DC | PRN

## 2013-07-04 MED ORDER — METHOCARBAMOL 500 MG PO TABS
ORAL_TABLET | ORAL | Status: AC
  Filled 2013-07-04: qty 2

## 2013-07-04 MED ORDER — PHENYLEPHRINE HCL 10 MG/ML IJ SOLN
10.0000 mg | INTRAVENOUS | Status: DC | PRN
  Administered 2013-07-04: 75 ug/min via INTRAVENOUS

## 2013-07-04 MED ORDER — HYDROMORPHONE HCL PF 1 MG/ML IJ SOLN
INTRAMUSCULAR | Status: AC
  Filled 2013-07-04: qty 1

## 2013-07-04 MED ORDER — LACTATED RINGERS IV SOLN
INTRAVENOUS | Status: DC | PRN
  Administered 2013-07-04 (×2): via INTRAVENOUS

## 2013-07-04 MED ORDER — ACETAMINOPHEN 650 MG RE SUPP: 650.0000 mg | Freq: Four times a day (QID) | RECTAL | Status: DC | PRN

## 2013-07-04 MED ORDER — HYDROMORPHONE HCL PF 1 MG/ML IJ SOLN
0.5000 mg | INTRAMUSCULAR | Status: DC | PRN
  Administered 2013-07-05 (×3): 1 mg via INTRAVENOUS
  Filled 2013-07-04 (×3): qty 1

## 2013-07-04 MED ORDER — STERILE WATER FOR IRRIGATION IR SOLN
Status: DC | PRN
  Administered 2013-07-04: 1000 mL

## 2013-07-04 MED ORDER — METOCLOPRAMIDE HCL 5 MG/ML IJ SOLN
5.0000 mg | Freq: Three times a day (TID) | INTRAMUSCULAR | Status: DC | PRN
  Administered 2013-07-05: 10 mg via INTRAVENOUS
  Filled 2013-07-04: qty 2

## 2013-07-04 MED ORDER — ONDANSETRON HCL 4 MG/2ML IJ SOLN
4.0000 mg | Freq: Four times a day (QID) | INTRAMUSCULAR | Status: DC | PRN
Start: 2013-07-04 — End: 2013-07-06
  Administered 2013-07-05: 4 mg via INTRAVENOUS
  Filled 2013-07-04: qty 2

## 2013-07-04 MED ORDER — SODIUM CHLORIDE 0.9 % IR SOLN
Status: DC | PRN
  Administered 2013-07-04 (×3): 3000 mL
  Administered 2013-07-04: 2000 mL

## 2013-07-04 MED ORDER — PROPOFOL 10 MG/ML IV BOLUS
INTRAVENOUS | Status: AC
  Filled 2013-07-04: qty 20

## 2013-07-04 MED ORDER — EPHEDRINE SULFATE 50 MG/ML IJ SOLN
INTRAMUSCULAR | Status: AC
  Filled 2013-07-04: qty 1

## 2013-07-04 MED ORDER — OXYCODONE HCL 5 MG PO TABS
5.0000 mg | ORAL_TABLET | Freq: Four times a day (QID) | ORAL | Status: DC | PRN
  Administered 2013-07-05: 5 mg via ORAL
  Administered 2013-07-05: 10 mg via ORAL
  Filled 2013-07-04: qty 2
  Filled 2013-07-04: qty 1
  Filled 2013-07-04: qty 2

## 2013-07-04 MED ORDER — STERILE WATER FOR INJECTION IJ SOLN
INTRAMUSCULAR | Status: AC
  Filled 2013-07-04: qty 10

## 2013-07-04 MED ORDER — ONDANSETRON HCL 4 MG/2ML IJ SOLN
INTRAMUSCULAR | Status: AC
  Filled 2013-07-04: qty 2

## 2013-07-04 MED ORDER — NEOSTIGMINE METHYLSULFATE 1 MG/ML IJ SOLN
INTRAMUSCULAR | Status: DC | PRN
  Administered 2013-07-04: 4 mg via INTRAVENOUS

## 2013-07-04 MED ORDER — METOCLOPRAMIDE HCL 10 MG PO TABS: 5.0000 mg | ORAL_TABLET | Freq: Three times a day (TID) | ORAL | Status: DC | PRN

## 2013-07-04 MED ORDER — GLYCOPYRROLATE 0.2 MG/ML IJ SOLN
INTRAMUSCULAR | Status: AC
  Filled 2013-07-04: qty 1

## 2013-07-04 MED ORDER — METHOCARBAMOL 500 MG PO TABS
500.0000 mg | ORAL_TABLET | Freq: Four times a day (QID) | ORAL | Status: DC | PRN
  Administered 2013-07-04 (×2): 1000 mg via ORAL
  Administered 2013-07-05 – 2013-07-06 (×2): 500 mg via ORAL
  Filled 2013-07-04 (×2): qty 1
  Filled 2013-07-04: qty 2

## 2013-07-04 MED ORDER — FENTANYL CITRATE 0.05 MG/ML IJ SOLN
INTRAMUSCULAR | Status: AC
  Filled 2013-07-04: qty 5

## 2013-07-04 MED ORDER — GLYCOPYRROLATE 0.2 MG/ML IJ SOLN
INTRAMUSCULAR | Status: DC | PRN
  Administered 2013-07-04: 0.6 mg via INTRAVENOUS
  Administered 2013-07-04: 0.1 mg via INTRAVENOUS

## 2013-07-04 MED ORDER — PAROXETINE HCL 20 MG PO TABS
20.0000 mg | ORAL_TABLET | Freq: Every day | ORAL | Status: DC
  Administered 2013-07-05 – 2013-07-06 (×2): 20 mg via ORAL
  Filled 2013-07-04 (×3): qty 1

## 2013-07-04 MED ORDER — ALBUTEROL SULFATE HFA 108 (90 BASE) MCG/ACT IN AERS
INHALATION_SPRAY | RESPIRATORY_TRACT | Status: AC
  Filled 2013-07-04: qty 6.7

## 2013-07-04 MED ORDER — ALUM & MAG HYDROXIDE-SIMETH 200-200-20 MG/5ML PO SUSP
30.0000 mL | ORAL | Status: DC | PRN
  Filled 2013-07-04: qty 30

## 2013-07-04 MED ORDER — BUPIVACAINE-EPINEPHRINE 0.5% -1:200000 IJ SOLN: INTRAMUSCULAR | Status: DC | PRN

## 2013-07-04 MED ORDER — OXYCODONE-ACETAMINOPHEN 5-325 MG PO TABS
1.0000 | ORAL_TABLET | Freq: Four times a day (QID) | ORAL | Status: DC | PRN
  Administered 2013-07-05: 1 via ORAL
  Administered 2013-07-05 – 2013-07-06 (×2): 2 via ORAL
  Filled 2013-07-04 (×2): qty 2
  Filled 2013-07-04: qty 1

## 2013-07-04 MED ORDER — LIDOCAINE HCL (CARDIAC) 20 MG/ML IV SOLN
INTRAVENOUS | Status: DC | PRN
  Administered 2013-07-04: 40 mg via INTRAVENOUS

## 2013-07-04 MED ORDER — PHENOL 1.4 % MT LIQD: 1.0000 | OROMUCOSAL | Status: DC | PRN

## 2013-07-04 MED ORDER — DIPHENHYDRAMINE HCL 12.5 MG/5ML PO ELIX: 12.5000 mg | ORAL_SOLUTION | ORAL | Status: DC | PRN

## 2013-07-04 MED ORDER — ONDANSETRON HCL 4 MG/2ML IJ SOLN
INTRAMUSCULAR | Status: DC | PRN
Start: 2013-07-04 — End: 2013-07-04
  Administered 2013-07-04: 4 mg via INTRAVENOUS

## 2013-07-04 MED ORDER — VITAMIN D3 25 MCG (1000 UNIT) PO TABS
1000.0000 [IU] | ORAL_TABLET | Freq: Every day | ORAL | Status: DC
  Administered 2013-07-05 – 2013-07-06 (×2): 1000 [IU] via ORAL
  Filled 2013-07-04 (×2): qty 1

## 2013-07-04 MED ORDER — HYDROMORPHONE HCL PF 1 MG/ML IJ SOLN
0.2500 mg | INTRAMUSCULAR | Status: DC | PRN
  Administered 2013-07-04 (×4): 0.5 mg via INTRAVENOUS

## 2013-07-04 MED ORDER — ALBUTEROL SULFATE (2.5 MG/3ML) 0.083% IN NEBU: 2.5000 mg | INHALATION_SOLUTION | Freq: Four times a day (QID) | RESPIRATORY_TRACT | Status: DC | PRN

## 2013-07-04 MED ORDER — DOCUSATE SODIUM 100 MG PO CAPS
100.0000 mg | ORAL_CAPSULE | Freq: Two times a day (BID) | ORAL | Status: DC
  Administered 2013-07-04 – 2013-07-06 (×4): 100 mg via ORAL
  Filled 2013-07-04 (×5): qty 1

## 2013-07-04 MED ORDER — ONDANSETRON HCL 4 MG PO TABS: 4.0000 mg | ORAL_TABLET | Freq: Four times a day (QID) | ORAL | Status: DC | PRN

## 2013-07-04 MED ORDER — MIDAZOLAM HCL 2 MG/2ML IJ SOLN
INTRAMUSCULAR | Status: AC
  Filled 2013-07-04: qty 2

## 2013-07-04 MED ORDER — CHLORHEXIDINE GLUCONATE 4 % EX LIQD: 60.0000 mL | Freq: Once | CUTANEOUS | Status: DC

## 2013-07-04 MED ORDER — BUPIVACAINE HCL (PF) 0.5 % IJ SOLN
INTRAMUSCULAR | Status: DC | PRN
  Administered 2013-07-04 (×2): 10 mL

## 2013-07-04 MED ORDER — ROCURONIUM BROMIDE 100 MG/10ML IV SOLN
INTRAVENOUS | Status: DC | PRN
  Administered 2013-07-04: 50 mg via INTRAVENOUS

## 2013-07-04 MED ORDER — ACETAMINOPHEN 325 MG PO TABS: 650.0000 mg | ORAL_TABLET | Freq: Four times a day (QID) | ORAL | Status: DC | PRN

## 2013-07-04 MED ORDER — GENTAMICIN SULFATE 40 MG/ML IJ SOLN
INTRAMUSCULAR | Status: AC
  Filled 2013-07-04: qty 6

## 2013-07-04 MED ORDER — ARTIFICIAL TEARS OP OINT
TOPICAL_OINTMENT | OPHTHALMIC | Status: AC
  Filled 2013-07-04: qty 3.5

## 2013-07-04 SURGICAL SUPPLY — 118 items
BANDAGE ELASTIC 4 VELCRO ST LF (GAUZE/BANDAGES/DRESSINGS) IMPLANT
BANDAGE ELASTIC 6 VELCRO ST LF (GAUZE/BANDAGES/DRESSINGS) IMPLANT
BANDAGE ESMARK 6X9 LF (GAUZE/BANDAGES/DRESSINGS) IMPLANT
BANDAGE GAUZE ELAST BULKY 4 IN (GAUZE/BANDAGES/DRESSINGS) IMPLANT
BENZOIN TINCTURE PRP APPL 2/3 (GAUZE/BANDAGES/DRESSINGS) IMPLANT
BIT DRILL TAK (DRILL) IMPLANT
BLADE AVERAGE 25MMX9MM (BLADE)
BLADE AVERAGE 25X9 (BLADE) IMPLANT
BLADE CUDA 4.2 (BLADE) IMPLANT
BLADE GREAT WHITE 4.2 (BLADE) ×2 IMPLANT
BLADE GREAT WHITE 4.2MM (BLADE) ×1
BLADE SURG 11 STRL SS (BLADE) ×3 IMPLANT
BNDG COHESIVE 6X5 TAN STRL LF (GAUZE/BANDAGES/DRESSINGS) IMPLANT
BNDG ESMARK 6X9 LF (GAUZE/BANDAGES/DRESSINGS)
BRUSH SCRUB DISP (MISCELLANEOUS) ×6 IMPLANT
BUR OVAL 4.0 (BURR) ×3 IMPLANT
CLEANER TIP ELECTROSURG 2X2 (MISCELLANEOUS) IMPLANT
CLOSURE WOUND 1/2 X4 (GAUZE/BANDAGES/DRESSINGS)
CLOTH BEACON ORANGE TIMEOUT ST (SAFETY) IMPLANT
CONT SPEC 4OZ CLIKSEAL STRL BL (MISCELLANEOUS) ×3 IMPLANT
COVER SURGICAL LIGHT HANDLE (MISCELLANEOUS) ×3 IMPLANT
CUFF TOURNIQUET SINGLE 18IN (TOURNIQUET CUFF) IMPLANT
CUFF TOURNIQUET SINGLE 24IN (TOURNIQUET CUFF) IMPLANT
CUFF TOURNIQUET SINGLE 34IN LL (TOURNIQUET CUFF) IMPLANT
DRAPE C-ARM 42X72 X-RAY (DRAPES) IMPLANT
DRAPE C-ARMOR (DRAPES) IMPLANT
DRAPE INCISE IOBAN 66X45 STRL (DRAPES) ×3 IMPLANT
DRAPE OEC MINIVIEW 54X84 (DRAPES) IMPLANT
DRAPE ORTHO SPLIT 77X108 STRL (DRAPES)
DRAPE STERI 35X30 U-POUCH (DRAPES) ×3 IMPLANT
DRAPE SURG ORHT 6 SPLT 77X108 (DRAPES) IMPLANT
DRAPE U-SHAPE 47X51 STRL (DRAPES) ×3 IMPLANT
DRILL TAK (DRILL)
DRSG ADAPTIC 3X8 NADH LF (GAUZE/BANDAGES/DRESSINGS) IMPLANT
DRSG EMULSION OIL 3X3 NADH (GAUZE/BANDAGES/DRESSINGS) IMPLANT
DRSG MEPILEX BORDER 4X4 (GAUZE/BANDAGES/DRESSINGS) ×6 IMPLANT
DRSG MEPILEX BORDER 4X8 (GAUZE/BANDAGES/DRESSINGS) ×3 IMPLANT
DRSG PAD ABDOMINAL 8X10 ST (GAUZE/BANDAGES/DRESSINGS) IMPLANT
ELECT CAUTERY BLADE 6.4 (BLADE) IMPLANT
ELECT MENISCUS 165MM 90D (ELECTRODE) IMPLANT
ELECT NEEDLE TIP 2.8 STRL (NEEDLE) IMPLANT
ELECT REM PT RETURN 9FT ADLT (ELECTROSURGICAL) ×3
ELECTRODE REM PT RTRN 9FT ADLT (ELECTROSURGICAL) ×1 IMPLANT
EVACUATOR 1/8 PVC DRAIN (DRAIN) IMPLANT
GLOVE BIO SURGEON STRL SZ 6.5 (GLOVE) ×2 IMPLANT
GLOVE BIO SURGEON STRL SZ7.5 (GLOVE) ×3 IMPLANT
GLOVE BIO SURGEON STRL SZ8 (GLOVE) ×9 IMPLANT
GLOVE BIO SURGEONS STRL SZ 6.5 (GLOVE) ×1
GLOVE BIOGEL PI IND STRL 6.5 (GLOVE) ×2 IMPLANT
GLOVE BIOGEL PI IND STRL 7.5 (GLOVE) ×1 IMPLANT
GLOVE BIOGEL PI IND STRL 8 (GLOVE) ×1 IMPLANT
GLOVE BIOGEL PI INDICATOR 6.5 (GLOVE) ×4
GLOVE BIOGEL PI INDICATOR 7.5 (GLOVE) ×2
GLOVE BIOGEL PI INDICATOR 8 (GLOVE) ×2
GOWN STRL REUS W/ TWL LRG LVL3 (GOWN DISPOSABLE) ×2 IMPLANT
GOWN STRL REUS W/ TWL XL LVL3 (GOWN DISPOSABLE) ×1 IMPLANT
GOWN STRL REUS W/TWL 2XL LVL3 (GOWN DISPOSABLE) ×6 IMPLANT
GOWN STRL REUS W/TWL LRG LVL3 (GOWN DISPOSABLE) ×4
GOWN STRL REUS W/TWL XL LVL3 (GOWN DISPOSABLE) ×2
KIT BASIN OR (CUSTOM PROCEDURE TRAY) ×3 IMPLANT
KIT INFUSE MEDIUM (Orthopedic Implant) ×3 IMPLANT
KIT ROOM TURNOVER OR (KITS) ×3 IMPLANT
KIT STIMULAN RAPID CURE  10CC (Orthopedic Implant) ×2 IMPLANT
KIT STIMULAN RAPID CURE 10CC (Orthopedic Implant) ×1 IMPLANT
MANIFOLD NEPTUNE II (INSTRUMENTS) ×3 IMPLANT
NDL SUT 2 .5 CRC MAYO 1.732X (NEEDLE) IMPLANT
NEEDLE 22X1 1/2 (OR ONLY) (NEEDLE) IMPLANT
NEEDLE 27GAX1X1/2 (NEEDLE) ×3 IMPLANT
NEEDLE HYPO 25GX1X1/2 BEV (NEEDLE) IMPLANT
NEEDLE MAYO TAPER (NEEDLE)
NEEDLE SPNL 18GX3.5 QUINCKE PK (NEEDLE) ×3 IMPLANT
NS IRRIG 1000ML POUR BTL (IV SOLUTION) ×3 IMPLANT
PACK ARTHROSCOPY DSU (CUSTOM PROCEDURE TRAY) ×3 IMPLANT
PACK ORTHO EXTREMITY (CUSTOM PROCEDURE TRAY) IMPLANT
PACK TOTAL JOINT (CUSTOM PROCEDURE TRAY) IMPLANT
PAD ARMBOARD 7.5X6 YLW CONV (MISCELLANEOUS) ×6 IMPLANT
PADDING CAST COTTON 6X4 STRL (CAST SUPPLIES) IMPLANT
SET ARTHROSCOPY TUBING (MISCELLANEOUS) ×4
SET ARTHROSCOPY TUBING LN (MISCELLANEOUS) ×2 IMPLANT
SLING ARM XL FOAM STRAP (SOFTGOODS) ×3 IMPLANT
SPEAR FASTAKII (SLEEVE) IMPLANT
SPECIMEN JAR SMALL (MISCELLANEOUS) IMPLANT
SPONGE GAUZE 4X4 12PLY (GAUZE/BANDAGES/DRESSINGS) IMPLANT
SPONGE LAP 18X18 X RAY DECT (DISPOSABLE) ×3 IMPLANT
SPONGE LAP 4X18 X RAY DECT (DISPOSABLE) IMPLANT
SPONGE SCRUB IODOPHOR (GAUZE/BANDAGES/DRESSINGS) IMPLANT
STAPLER VISISTAT 35W (STAPLE) ×3 IMPLANT
STOCKINETTE IMPERVIOUS LG (DRAPES) IMPLANT
STRIP CLOSURE SKIN 1/2X4 (GAUZE/BANDAGES/DRESSINGS) IMPLANT
SUCTION FRAZIER TIP 10 FR DISP (SUCTIONS) ×3 IMPLANT
SUT BONE WAX W31G (SUTURE) IMPLANT
SUT ETHIBOND 2 OS 4 DA (SUTURE) IMPLANT
SUT ETHIBOND NAB BRD #0 18IN (SUTURE) IMPLANT
SUT ETHILON 3 0 PS 1 (SUTURE) ×6 IMPLANT
SUT ETHILON 4 0 PS 2 18 (SUTURE) IMPLANT
SUT FIBERWIRE #2 38 T-5 BLUE (SUTURE)
SUT MON AB 4-0 PC3 18 (SUTURE) IMPLANT
SUT PDS AB 0 CT 36 (SUTURE) ×3 IMPLANT
SUT PDS AB 2-0 CT1 27 (SUTURE) ×3 IMPLANT
SUT VIC AB 0 CT1 27 (SUTURE)
SUT VIC AB 0 CT1 27XBRD ANBCTR (SUTURE) IMPLANT
SUT VIC AB 0 CT2 27 (SUTURE) IMPLANT
SUT VIC AB 2-0 CT1 27 (SUTURE)
SUT VIC AB 2-0 CT1 TAPERPNT 27 (SUTURE) IMPLANT
SUT VIC AB 2-0 FS1 27 (SUTURE) IMPLANT
SUTURE FIBERWR #2 38 T-5 BLUE (SUTURE) IMPLANT
SWAB COLLECTION DEVICE MRSA (MISCELLANEOUS) ×3 IMPLANT
SYR BULB 3OZ (MISCELLANEOUS) IMPLANT
SYR CONTROL 10ML LL (SYRINGE) ×3 IMPLANT
TOWEL OR 17X24 6PK STRL BLUE (TOWEL DISPOSABLE) ×6 IMPLANT
TOWEL OR 17X26 10 PK STRL BLUE (TOWEL DISPOSABLE) ×3 IMPLANT
TUBE ANAEROBIC SPECIMEN COL (MISCELLANEOUS) ×3 IMPLANT
TUBE CONNECTING 12'X1/4 (SUCTIONS)
TUBE CONNECTING 12X1/4 (SUCTIONS) IMPLANT
UNDERPAD 30X30 INCONTINENT (UNDERPADS AND DIAPERS) IMPLANT
WAND 90 DEG TURBOVAC W/CORD (SURGICAL WAND) ×6 IMPLANT
WATER STERILE IRR 1000ML POUR (IV SOLUTION) ×3 IMPLANT
YANKAUER SUCT BULB TIP NO VENT (SUCTIONS) IMPLANT

## 2013-07-04 NOTE — Brief Op Note (Signed)
07/04/2013  6:33 PM  PATIENT:  Alfredo Martinezichard Molter  73 y.o. male  PRE-OPERATIVE DIAGNOSIS:   1. RIGHT SHOULDER SUBACROMIAL IMPINGEMENT 2. Suspected undersurface cuff tear and synovitis, mild arthritis 3. RIGHT CLAVICLE nonunion  4. symptomatic HARDWARE  POST-OPERATIVE DIAGNOSIS:   1. RIGHT SHOULDER SUBACROMIAL IMPINGEMENT 2. Undersurface cuff tear, synovitis, moderate glenoid arthritis, severe degenerative SLAP tear 3. RIGHT CLAVICLE nonunion  4. symptomatic HARDWARE  PROCEDURE:   1. RIGHT SHOULDER ARTHROSCOPY WITH SUBACROMIAL DECOMPRESSION  (Right) 2. RIGHT clavicle repair of nonunion with Infuse 3. RIGHT HARDWARE REMOVAL FROM CLAVICLE (Right) 4. Extensive debridement SLAP tear, synovitis, cuff undersurface tear, and chondroplasty  SURGEON:  Surgeon(s) and Role:    * Budd PalmerMichael H Calob Baskette, MD - Primary  PHYSICIAN ASSISTANT: Montez MoritaKeith Paul, PA-C  ANESTHESIA:   general  I/O:  Total I/O In: 1500 [I.V.:1500] Out: 100 [Blood:100]  SPECIMEN:  No Specimen and Source of Specimen:  cxs anaerobic, aerobic, fungus; and implants  DISPOSITION OF SPECIMEN:  To micro  TOURNIQUET:  * No tourniquets in log *  DICTATION: [id number]

## 2013-07-04 NOTE — H&P (Signed)
I have seen and examined the patient. I agree with the findings above.  I discussed with the patient the risks and benefits of surgery for his subacromial impingement and symptomatic hardware with probable low grade infection, including the possibility of persistent or recurrent infection, persistent nonunion, nerve injury, vessel injury, wound breakdown, arthritis, symptomatic hardware, DVT/ PE, loss of motion, and need for further surgery among others.  We furthermore discussed the possibility of Infuse allografting in the event there is no clinical or lab evidence of infection. He understood these risks and wished to proceed.   Budd PalmerHANDY,Joi Leyva H, MD 07/04/2013 10:48 AM

## 2013-07-04 NOTE — Addendum Note (Signed)
Addendum created 07/04/13 1622 by Judie Petitharlene Edwards, MD   Modules edited: Anesthesia Attestations

## 2013-07-04 NOTE — Anesthesia Postprocedure Evaluation (Signed)
  Anesthesia Post-op Note  Patient: [name] [name]  Procedure(s) Performed: Procedure(s): RIGHT SHOULDER ARTHROSCOPY WITH SUBACROMIAL DECOMPRESSION  (Right) RIGHT HARDWARE REMOVAL FROM CLAVICAL (Right)  Patient Location: PACU  Anesthesia Type:General  Level of Consciousness: awake  Airway and Oxygen Therapy: Patient Spontanous Breathing  Post-op Pain: mild  Post-op Assessment: Post-op Vital signs reviewed  Post-op Vital Signs: Reviewed  Complications: No apparent anesthesia complications

## 2013-07-04 NOTE — Preoperative (Signed)
Beta Blockers   Reason not to administer Beta Blockers:Not Applicable 

## 2013-07-04 NOTE — Anesthesia Procedure Notes (Signed)
Procedure Name: Intubation Date/Time: 07/04/2013 11:04 AM Performed by: Gayla MedicusHYPES, Kamaiyah Uselton M. Pre-anesthesia Checklist: Patient identified, Patient being monitored, Emergency Drugs available, Timeout performed and Suction available Patient Re-evaluated:Patient Re-evaluated prior to inductionOxygen Delivery Method: Circle system utilized Preoxygenation: Pre-oxygenation with 100% oxygen Intubation Type: IV induction Ventilation: Mask ventilation without difficulty Laryngoscope Size: Mac and 3 Grade View: Grade I Tube type: Oral Tube size: 7.5 mm Number of attempts: 1 Airway Equipment and Method: Stylet Placement Confirmation: ETT inserted through vocal cords under direct vision,  positive ETCO2 and breath sounds checked- equal and bilateral Secured at: 22 cm Tube secured with: Tape Dental Injury: Teeth and Oropharynx as per pre-operative assessment

## 2013-07-04 NOTE — Transfer of Care (Signed)
Immediate Anesthesia Transfer of Care Note  Patient: [name] MartinezRichard [name]  Procedure(s) Performed: Procedure(s): RIGHT SHOULDER ARTHROSCOPY WITH SUBACROMIAL DECOMPRESSION  (Right) RIGHT HARDWARE REMOVAL FROM CLAVICAL (Right)  Patient Location: PACU  Anesthesia Type:General  Level of Consciousness: awake, alert  and oriented  Airway & Oxygen Therapy: Patient Spontanous Breathing and Patient connected to nasal cannula oxygen  Post-op Assessment: Report given to PACU RN, Post -op Vital signs reviewed and stable and Patient moving all extremities X 4  Post vital signs: Reviewed and stable  Complications: No apparent anesthesia complications

## 2013-07-04 NOTE — Anesthesia Preprocedure Evaluation (Addendum)
Anesthesia Evaluation  Patient identified by MRN, date of birth, ID band Patient awake    Reviewed: Allergy & Precautions, H&P , NPO status , Patient's Chart, lab work & pertinent test results  History of Anesthesia Complications Negative for: history of anesthetic complications  Airway Mallampati: II TM Distance: >3 FB Neck ROM: Full    Dental  (+) Teeth Intact and Dental Advisory Given   Pulmonary shortness of breath and Long-Term Oxygen Therapy, COPD COPD inhaler, former smoker (quit '89),          Cardiovascular     Neuro/Psych PSYCHIATRIC DISORDERS (PTSD) Depression S/p craniotomy    GI/Hepatic   Endo/Other    Renal/GU      Musculoskeletal   Abdominal   Peds  Hematology   Anesthesia Other Findings   Reproductive/Obstetrics                        Anesthesia Physical Anesthesia Plan  ASA: III  Anesthesia Plan: General   Post-op Pain Management:    Induction: Intravenous  Airway Management Planned: Oral ETT  Additional Equipment:   Intra-op Plan:   Post-operative Plan: Extubation in OR  Informed Consent: I have reviewed the patients History and Physical, chart, labs and discussed the procedure including the risks, benefits and alternatives for the proposed anesthesia with the patient or authorized representative who has indicated his/her understanding and acceptance.   Dental advisory given  Plan Discussed with: CRNA, Anesthesiologist and Surgeon  Anesthesia Plan Comments:        Anesthesia Quick Evaluation

## 2013-07-05 ENCOUNTER — Encounter (HOSPITAL_COMMUNITY): Payer: Self-pay | Admitting: General Practice

## 2013-07-05 NOTE — Progress Notes (Signed)
Ur completed     

## 2013-07-05 NOTE — Op Note (Signed)
NAMMarland Kitchen:  Annalee GentaMIDDLETON, Kaevion           ACCOUNT NO.:  [id number]  MEDICAL RECORD NO.:  [id number]  LOCATION:  [id number]                        FACILITY:  [facility]  PHYSICIAN:  Doralee AlbinoMichael H. Carola FrostHandy, M.D. DATE OF BIRTH:  08/13/1940  DATE OF PROCEDURE:  07/04/2013 DATE OF DISCHARGE:                              OPERATIVE REPORT   PREOPERATIVE DIAGNOSES: 1. Right shoulder subacromial impingement. 2. Mild arthritis and undersurface cuff tear with associated     synovitis. 3. Right clavicle nonunion. 4. Symptomatic hardware.  POSTOPERATIVE DIAGNOSES: 1. Right shoulder subacromial impingement. 2. Undersurface cuff tear and synovitis. 3. Moderate glenoid arthritis with severe degenerative SLAP tear. 4. Right clavicle nonunion. 5. Symptomatic hardware.  PROCEDURE: 1. Right shoulder arthroscopic subacromial decompression. 2. 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 3. Extensive debridement of the shoulder including SLAP tear,     synovitis undersurface cuff tear, and a chondroplasty of the     glenoid arthritis. 4. Removal of deep implant  SURGEON:  Doralee AlbinoMichael H. Carola FrostHandy, MD  ASSISTANT:  Mearl LatinKeith W Paul, PA-C  ANESTHESIA:  General.  COMPLICATIONS:  None.  SPECIMENS:  Multiple, including anaerobic, aerobic, and fungal swabs sent to micro as well as the implants also sent for culture.  FINDINGS:  No bacteria.  DISPOSITION:  To PACU.  CONDITION:  Stable.  BRIEF SUMMARY AND INDICATIONS FOR PROCEDURE:  [name] [name] is a very pleasant 73 year old, right-hand dominant male who had a remote clavicle nonunion with postoperative infection.  I performed a debridement and repair approximately 2 years ago, with improvement but not complete union and then a secondary procedure resulted in infection. He continued to have symptoms with the right shoulder but these have really isolated to impingement.  He still has had intermittent redness around the plate and was presumed to be persistently  infected.  It was loose and nonunion appeared to still persist.  A CT also did demonstrate some mild arthritic changes.  I discussed with him the risks and benefits of surgery including the possibility of infection, persistent infection, new infection, nerve injury, vessel injury, persistent nonunion, DVT, PE, failure to alleviate all symptoms including the possibility of future shoulder arthroplasty, and many others.  He did wish to proceed with surgical debridement and removal of hardware with nonunion repair present.  BRIEF DESCRIPTION OF PROCEDURE:  Antibiotics were held.  The patient was taken to the operating room where general anesthesia was induced.  His right upper extremity was prepped and draped in usual sterile fashion. I began with intra-articular evaluation of the right glenohumeral joint demonstrating severe degenerative SLAP tear with infolding of torn capsule, extensive synovitis, partial undersurface cuff tear, and some calcification that appeared to be similar to those seen in steroid injections.  There were some grade 4 changes of the glenoid as well. The biceps tendon, otherwise was intact despite some inflammation around it.  The proximal humeral cartilage surface had scattered areas of grade 2 and 3 changes but no grade 4.  The cuff did not have a discernible full-thickness tear.  These areas were debrided again extensively in all areas of the joint and did require an assistant to position the surfaces and facilitate this with destruction of the shoulder joint.  The  clavicle and the incisions were then withdrawn from the joint.  The clavicle was then exposed through a direct cephalad approach. Dissection was carried down where some clear fluid was seen around the plate extending anteriorly into the nonunion site.  Did not encounter any purulence, did not encounter any significant or large collection of fluid in contrast to previous operations.  The nonunion site  itself was then exposed with a 15 blade and multiple cultures were passed as well as small screws in the distal aspect of the plate for culture also. Antibiotics were given immediately after obtaining cultures.  Curettes were used to debride back healthy bleeding bone and then antibiotic beads using vancomycin and gentamicin were inserted followed by infuse, then additional layers of antibiotics stimulant beads and infuse such that the entire medium pack and 4 infuse sponges were applied in this area including 1 overlapping both dorsally.  No hardware was used given the previous problems with infection.  A standard layered closure was performed with PDS and nylon.  Attention was then turned to the subacromial space where a port of [place] was established laterally and the distal posterior incision was used to advance the arthroscope to the anterior aspect of the shoulder.  Bursa was removed revealing the cuff intact underneath.  The arthroscopic wand was used to ablate the periosteum and identify the leading edge of the acromion as well as a large undersurface spur.  This was clearly delineated and then the bur used to shave this area down and release the pressure from the anterolateral aspect of the acromion.  The instrument was then inserted from the back to achieve a bone block type cutting to further create space for the cup.  The viewing portal was switched back to port of [place], confirming appropriate debridement and decompression.  The shoulder was then injected with Marcaine using approximately 20 mL.  The patient was then awakened from anesthesia and transported to the PACU in stable condition.  Montez Morita, PA-C did assist me throughout and assistant was used and necessary during all portions of the procedure.  PROGNOSIS:  [name] remains at high risk for persistent nonunion, recurrent infection, given his previous failures.  We are hopeful that he can go on to  experience a significant relief with the debridement of all his intra- articular pathology.  Furthermore, we are hopeful that the remaining cartilage on his humeral head is sufficient to allow tolerate the glenoid arthritis but future intra-articular joint injections or evaluation for total joint replacement would not be unanticipated.     Doralee Albino. Carola Frost, M.D.     MHH/MEDQ  D:  07/04/2013  T:  07/05/2013  Job:  [id number]

## 2013-07-05 NOTE — Progress Notes (Signed)
[facility] Progress Note  Subjective  Doing ok this am Moderate pain R shoulder Controlled with IV pain meds Some nausea and vomiting No really that hungry   Review of Systems  Constitutional: Negative for fever and chills.  Respiratory: Negative for shortness of breath.   Cardiovascular: Negative for chest pain and palpitations.  Gastrointestinal: Positive for nausea and vomiting. Negative for abdominal pain.  Genitourinary: Negative for dysuria.  Neurological: Negative for tingling.     Objective   BP 153/80  Pulse 93  Temp(Src) 98.2 F (36.8 C) (Oral)  Resp 16  SpO2 95%  Intake/Output     02/03 0701 - 02/04 0700 02/04 0701 - 02/05 0700   P.O. 360    I.V. 1500    Total Intake 1860     Urine 600    Blood 100    Total Output 700     Net +1160            Labs  No new labs  Exam  Gen: awake and alert, NAD, comfortable Lungs: clear anterior fields Cardiac: RRR, s1 and s2 Abd: + BS, NTND Ext:       Right Upper Extremity  Dressing c/d/i  Motor and sensory functions intact  Ext warm  + Radial pulse  Swelling controlled   Assessment and Plan   POD/HD#: 1   73 y/o male s/p repair of R clavicle nonunion, R shoulder arthritis, R shoulder subacromial impingement  1. R clavicle nonunion, R subacromial impingement, R shoulder arthritis s/p arthrosocpy, HW removal and repair of nonunion   Motion as tolerated  No heavy lifting  OT consult  Ice prn  Sling for comfort  2. COPD  Nebulizer as needed  3. PTSD  Home meds  4. DVT/PE prophylaxis  SDCs  No pharmacologics indicated for UEx   5. FEN  Diet as tolerated  Monitor N/V  Zofran/phenergan   6. Pain  Continue with current regimen  Encourage POs  7. Dispo  OT consult  Dc home tomorrrow     Mearl LatinKeith W. Sarkis Rhines, PA-C [facility] [phone] (P) 07/05/2013 8:36 AM

## 2013-07-05 NOTE — Evaluation (Signed)
Occupational Therapy Evaluation Patient Details Name: [name] MartinezRichard [name] MRN: [id number] DOB: 01/19/1941 Today's Date: 07/05/2013 Time: [id number] OT Time Calculation (min): 33 min  OT Assessment / Plan / Recommendation History of present illness Right shoulder arthroscopic subacromial decompression. 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 Extensive debridement of the shoulder including SLAP tear, Removal hardware Right clavicle.   Clinical Impression   Pt presents s/p Right shoulder surgery as above, he will benefit from instruction in home program and R UE sling use, ADL's. Awaiting clarification from MD re: Possible AROM for R shoulder ?ABD and forward flexion to 90 degrees vs No AROM and progress at MD f/u appointment.     OT Assessment  Patient needs continued OT Services;Progress rehab of shoulder as ordered by MD at follow-up appointment    Follow Up Recommendations  Supervision - Intermittent;No OT follow up;Other (comment) (Progress rehab of shoulder as ordered by MD at follow up appointment)    Barriers to Discharge      Equipment Recommendations  None recommended by OT    Recommendations for Other Services    Frequency  Min 2X/week    Precautions / Restrictions Precautions Precautions: Shoulder Shoulder Interventions: Shoulder sling/immobilizer;Off for dressing/bathing/exercises Precaution Booklet Issued: Yes (comment) Precaution Comments: Handout issued and reviewed w/ pt. No orders in pt chart for AROM, therefore did not issue shoulder ABD or forard flexion to 90* as frequently issued Required Braces or Orthoses: Sling Restrictions Weight Bearing Restrictions: No   Pertinent Vitals/Pain 8/10 R shoulder pain. Pt repots having had pain medication 1/2 hr prior to therapy session.    ADL  Eating/Feeding: Simulated;Independent Where Assessed - Eating/Feeding: Chair Grooming: Simulated;Set up;Min guard Where Assessed - Grooming: Unsupported sitting Upper  Body Bathing: Simulated;Minimal assistance Where Assessed - Upper Body Bathing: Unsupported sitting Lower Body Bathing: Simulated;Minimal assistance Where Assessed - Lower Body Bathing: Unsupported sitting;Unsupported sit to stand Upper Body Dressing: Simulated;Minimal assistance Where Assessed - Upper Body Dressing: Unsupported sit to stand Lower Body Dressing: Simulated;Minimal assistance Where Assessed - Lower Body Dressing: Unsupported sit to stand Toilet Transfer: Performed;Supervision/safety;Min guard Toilet Transfer Method: Sit to [place] Toilet Transfer Equipment: Comfort height toilet;Grab bars Toileting - Clothing Manipulation and Hygiene: Simulated;Min guard Where Assessed - Toileting Clothing Manipulation and Hygiene: Sit to stand from 3-in-1 or toilet Tub/Shower Transfer Method: Not assessed Equipment Used: Gait belt;Other (comment) (R UE sling) Transfers/Ambulation Related to ADLs: Pt overall supervision - min guard assist for functional mobility secondary to pt initial report of feeling "A little dizzy" upon sitting up and standing. ADL Comments: Pt was educated in role of OT. He was given verbal and written instruction in sling use, care and precautions, positioning during sleep and dressing techniques, AROM for R UE elbow, wrist and hand. No lifting, pushing, pulling w/ R UE until MD clears him but can use R hand for light activites (brushing teeth, eating etc). Pt  verbalized understanding of all of the above.    OT Diagnosis: Acute pain;Other (comment) (Awaiting clarification of MD orders for AROM RUE vs No AROM)  OT Problem List: Pain;Impaired UE functional use;Decreased knowledge of precautions OT Treatment Interventions: Self-care/ADL training;Therapeutic exercise;Patient/family education   OT Goals(Current goals can be found in the care plan section) Acute Rehab OT Goals Patient Stated Goal: Home tomorrow Time For Goal Achievement: 07/07/13 Potential to Achieve Goals:  Good  Visit Information  Last OT Received On: 07/05/13 History of Present Illness: Right shoulder  arthroscopic subacromial decompression. 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 Extensive  debridement of the shoulder including SLAP tear,       Prior Functioning     Home Living Family/patient expects to be discharged to:: Private residence Living Arrangements: Spouse/significant other Available Help at Discharge: Available 24 hours/day Type of Home: House Home Access: Stairs to enter [employer] of Steps: 3 Entrance Stairs-Rails: Can reach both Home Layout: One level Home Equipment: Environmental consultant - 2 wheels Prior Function Level of Independence: Independent Communication Communication: No difficulties Dominant Hand: Right    Vision/Perception Vision - Assessment Vision Assessment: Vision not tested   Cognition  Cognition Arousal/Alertness: Awake/alert Behavior During Therapy: WFL for tasks assessed/performed Overall Cognitive Status: Within Functional Limits for tasks assessed    Extremity/Trunk Assessment Upper Extremity Assessment Upper Extremity Assessment: RUE deficits/detail RUE Deficits / Details: R shoulder in sling for comfort RUE: Unable to fully assess due to pain;Unable to fully assess due to immobilization Lower Extremity Assessment Lower Extremity Assessment: Overall WFL for tasks assessed    Mobility Bed Mobility Overal bed mobility: Independent Transfers Overall transfer level: Needs assistance Transfers: Sit to/from Stand Sit to Stand: Supervision;Min guard General transfer comment: Pt with initial report of feeling dizzy upon sitting up and standing.    Exercise Donning/doffing shirt without moving shoulder: Minimal assistance Method for sponge bathing under operated UE: Minimal assistance Donning/doffing sling/immobilizer: Supervision/safety;Minimal assistance Correct positioning of sling/immobilizer: Supervision/safety ROM for  elbow, wrist and digits of operated UE: Modified independent Sling wearing schedule (on at all times/off for ADL's): Modified independent (For comfort) Positioning of UE while sleeping: Modified independent   Balance Balance Overall balance assessment: Needs assistance Sitting-balance support: No upper extremity supported;Feet supported Sitting balance-Leahy Scale: Good Standing balance support: No upper extremity supported Standing balance-Leahy Scale: Fair   End of Session OT - End of Session Equipment Utilized During Treatment: Gait belt Activity Tolerance: Patient tolerated treatment well Patient left: in chair;with call bell/phone within reach Nurse Communication: Mobility status;Other (comment) (Awaiting MD clarification of OT orders ?No AROM R  shoulder vs 90*  ABD & forward flexion, note left in chart.)  GO Functional Assessment Tool Used: Clinical judgement Functional Limitation: Self care Self Care Current Status (J4782): At least 40 percent but less than 60 percent impaired, limited or restricted Self Care Goal Status (N5621): At least 40 percent but less than 60 percent impaired, limited or restricted   Alm Bustard 07/05/2013, 11:36 AM

## 2013-07-06 ENCOUNTER — Encounter (HOSPITAL_COMMUNITY): Payer: Self-pay | Admitting: Orthopedic Surgery

## 2013-07-06 DIAGNOSIS — M7541 Impingement syndrome of right shoulder: Secondary | ICD-10-CM

## 2013-07-06 DIAGNOSIS — M19019 Primary osteoarthritis, unspecified shoulder: Secondary | ICD-10-CM | POA: Diagnosis present

## 2013-07-06 HISTORY — DX: Primary osteoarthritis, unspecified shoulder: M19.019

## 2013-07-06 HISTORY — DX: Impingement syndrome of right shoulder: M75.41

## 2013-07-06 LAB — WOUND CULTURE
CULTURE: NO GROWTH
Culture: NO GROWTH

## 2013-07-06 MED ORDER — METHOCARBAMOL 500 MG PO TABS: 500.0000 mg | ORAL_TABLET | Freq: Four times a day (QID) | ORAL | Status: DC | PRN

## 2013-07-06 MED ORDER — OXYCODONE-ACETAMINOPHEN 10-325 MG PO TABS: 1.0000 | ORAL_TABLET | Freq: Four times a day (QID) | ORAL | Status: DC | PRN

## 2013-07-06 MED ORDER — GUAIFENESIN 100 MG/5ML PO SYRP
200.0000 mg | ORAL_SOLUTION | ORAL | Status: DC | PRN
  Administered 2013-07-06: 200 mg via ORAL
  Filled 2013-07-06: qty 10

## 2013-07-06 NOTE — Discharge Summary (Signed)
Orthopaedic Trauma Service (OTS)  Patient ID: [name] [name] MRN: [id number] DOB/AGE: Oct 19, 1940 73 y.o.  Admit date: 07/04/2013 Discharge date: 07/06/2013  Admission Diagnoses: Nonunion right clavicle Right shoulder arthritis Impingement right shoulder COPD Depression  Discharge Diagnoses:  Principal Problem:   Nonunion, fracture Active Problems:   COPD (chronic obstructive pulmonary disease)   Depression   Shoulder arthritis, Right    Impingement syndrome of right shoulder   Procedures Performed: 07/05/2013- Dr. Carola Frost  1. Right shoulder arthroscopic subacromial decompression.  2. 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  3. Extensive debridement of the shoulder including SLAP tear,  synovitis undersurface cuff tear, and a chondroplasty of the  glenoid arthritis.   Discharged Condition: good  Hospital Course:    73 year old right-hand-dominant white male well known to the orthopedic trauma service for a nonunion of his right clavicle. Patient has had multiple procedures regarding this issue and has subsequently continued to have a nonunion. He has had some episodes of a superficial infection which was thought to extend deep to the nonunion site. He has been treated with long-term antibiotics. He presented on this admission with right shoulder pain. Arthroscopic debridement of his right shoulder was performed on the date noted above which demonstrated extensive right shoulder degenerative changes. He also had a subacromial decompression via arthroscopy as well. In addition we did remove his clavicle plate and graft the residual nonunion. Patient underwent the procedure described above on 07/05/2013. He tolerated procedure well. We did admit him overnight secondary to his COPD. He continued to have pretty extensive pain on postoperative day #1 and was still requiring IV pain medication. We did arrange for OT evaluation for home exercises. Operative day #1 he  was doing much better. His pain was controlled with oral pain medication. As such she was deemed stable for discharge to home on postoperative day #2. There were no perioperative issues of note patient was tolerating a diet at time of discharge she was voiding under his own power and did have a bowel movement prior to discharge as well. No other complaints were noted upon discharge.  Consults: None  Significant Diagnostic Studies: labs:  None  Microbiology is pending at the time of discharge Gram stains negative for multiple sources  Treatments: IV hydration, antibiotics: Ancef, analgesia: acetaminophen, Dilaudid and oxycodone and Percocet, respiratory therapy: albuterol/atropine nebulizer, therapies: OT and RN and surgery: As above  Discharge Exam:   Orthopaedic Trauma Service Progress Note  Subjective  Doing well, no complaints Ready to go home And controlled with oral medicine    Objective   BP 138/74  Pulse 85  Temp(Src) 98.5 F (36.9 C) (Oral)  Resp 19  SpO2 99%  Intake/Output     02/04 0701 - 02/05 0700 02/05 0701 - 02/06 0700    P.O. 480 360    I.V.      IV Piggyback 50     Total Intake 530 360    Urine 150     Blood      Total Output 150      Net +380 +360          Stool Occurrence 1 x       Labs  None   Exam  Gen: Patient is awake and alert, resting comfortably in bed, no acute distress Lungs: Clear anterior fields Cardiac: Regular rate and rhythm Abd: Soft, nontender, nondistended Ext:        Right upper extremity  Dressing c/d/i             Incision is stable, no signs of infection             Motor and sensory functions intact             Ext warm             + Radial pulse             Swelling controlled     Assessment and Plan   POD/HD#: 2     73 y/o male s/p repair of R clavicle nonunion, R shoulder arthritis, R shoulder subacromial impingement  1. R clavicle nonunion, R subacromial impingement, R shoulder  arthritis s/p arthrosocpy, HW removal and repair of nonunion               Motion as tolerated             No heavy lifting             OT consult             Ice prn             Sling for comfort  2. COPD             Nebulizer as needed  3. PTSD             Home meds  4. DVT/PE prophylaxis             SDCs             No pharmacologics indicated for UEx   5. FEN             Diet as tolerated              6. Pain            PO meds  7. Dispo              discharge home today             Follow up in 2 weeks   [name] LatinKeith W. Jahzier Villalon, PA-C [facility] [phone] (P) 07/06/2013 9:42 AM      Disposition: 01-Home or Self Care  Discharge Orders   Future Orders Complete By Expires   Call MD / Call 911  As directed    Comments:     If you experience chest pain or shortness of breath, CALL 911 and be transported to the hospital emergency room.  If you develope a fever above 101 F, pus (white drainage) or increased drainage or redness at the wound, or calf pain, call your surgeon's office.   Constipation Prevention  As directed    Comments:     Drink plenty of fluids.  Prune juice may be helpful.  You may use a stool softener, such as Colace (over the counter) 100 mg twice a day.  Use MiraLax (over the counter) for constipation as needed.   Diet - low sodium heart healthy  As directed    Discharge instructions  As directed    Comments:     Orthopaedic Trauma Service Discharge Instructions   General Discharge Instructions  WEIGHT BEARING STATUS: No lifting greater than 5 pounds with right upper extremity, no firearms until further notice  RANGE OF MOTION/ACTIVITY: Range of motion as tolerated, sling for comfort  Diet: as you were eating previously.  Can use over the counter stool softeners and bowel preparations, such as Miralax, to  help with bowel movements.  Narcotics can be constipating.  Be sure to drink plenty of fluids  STOP SMOKING OR USING NICOTINE  PRODUCTS!!!!  As discussed nicotine severely impairs your body's ability to heal surgical and traumatic wounds but also impairs bone healing.  Wounds and bone heal by forming microscopic blood vessels (angiogenesis) and nicotine is a vasoconstrictor (essentially, shrinks blood vessels).  Therefore, if vasoconstriction occurs to these microscopic blood vessels they essentially disappear and are unable to deliver necessary nutrients to the healing tissue.  This is one modifiable factor that you can do to dramatically increase your chances of healing your injury.    (This means no smoking, no nicotine gum, patches, etc)  DO NOT USE NONSTEROIDAL ANTI-INFLAMMATORY DRUGS (NSAID'S)  Using products such as Advil (ibuprofen), Aleve (naproxen), Motrin (ibuprofen) for additional pain control during fracture healing can delay and/or prevent the healing response.  If you would like to take over the counter (OTC) medication, Tylenol (acetaminophen) is ok.  However, some narcotic medications that are given for pain control contain acetaminophen as well. Therefore, you should not exceed more than 4000 mg of tylenol in a day if you do not have liver disease.  Also note that there are may OTC medicines, such as cold medicines and allergy medicines that my contain tylenol as well.  If you have any questions about medications and/or interactions please ask your doctor/PA or your pharmacist.   PAIN MEDICATION USE AND EXPECTATIONS  You have likely been given narcotic medications to help control your pain.  After a traumatic event that results in an fracture (broken bone) with or without surgery, it is ok to use narcotic pain medications to help control one's pain.  We understand that everyone responds to pain differently and each individual patient will be evaluated on a regular basis for the continued need for narcotic medications. Ideally, narcotic medication use should last no more than 6-8 weeks (coinciding with fracture  healing).   As a patient it is your responsibility as well to monitor narcotic medication use and report the amount and frequency you use these medications when you come to your office visit.   We would also advise that if you are using narcotic medications, you should take a dose prior to therapy to maximize you participation.  IF YOU ARE ON NARCOTIC MEDICATIONS IT IS NOT PERMISSIBLE TO OPERATE A MOTOR VEHICLE (MOTORCYCLE/CAR/TRUCK/MOPED) OR HEAVY MACHINERY DO NOT MIX NARCOTICS WITH OTHER CNS (CENTRAL NERVOUS SYSTEM) DEPRESSANTS SUCH AS ALCOHOL       ICE AND ELEVATE INJURED/OPERATIVE EXTREMITY  Using ice and elevating the injured extremity above your heart can help with swelling and pain control.  Icing in a pulsatile fashion, such as 20 minutes on and 20 minutes off, can be followed.    Do not place ice directly on skin. Make sure there is a barrier between to skin and the ice pack.    Using frozen items such as frozen peas works well as the conform nicely to the are that needs to be iced.  USE AN ACE WRAP OR TED HOSE FOR SWELLING CONTROL  In addition to icing and elevation, Ace wraps or TED hose are used to help limit and resolve swelling.  It is recommended to use Ace wraps or TED hose until you are informed to stop.    When using Ace Wraps start the wrapping distally (farthest away from the body) and wrap proximally (closer to the body)   Example: If you had surgery  on your leg or thing and you do not have a splint on, start the ace wrap at the toes and work your way up to the thigh        If you had surgery on your upper extremity and do not have a splint on, start the ace wrap at your fingers and work your way up to the upper arm  IF YOU ARE IN A SPLINT OR CAST DO NOT REMOVE IT FOR ANY REASON   If your splint gets wet for any reason please contact the office immediately. You may shower in your splint or cast as long as you keep it dry.  This can be done by wrapping in a cast cover or  garbage back (or similar)  Do Not stick any thing down your splint or cast such as pencils, money, or hangers to try and scratch yourself with.  If you feel itchy take benadryl as prescribed on the bottle for itching  IF YOU ARE IN A CAM BOOT (BLACK BOOT)  You may remove boot periodically. Perform daily dressing changes as noted below.  Wash the liner of the boot regularly and wear a sock when wearing the boot. It is recommended that you sleep in the boot until told otherwise  CALL THE OFFICE WITH ANY QUESTIONS OR CONCERTS: [phone]     Discharge Pin Site Instructions  Dress pins daily with Kerlix roll starting on POD 2. Wrap the Kerlix so that it tamps the skin down around the pin-skin interface to prevent/limit motion of the skin relative to the pin.  (Pin-skin motion is the primary cause of pain and infection related to external fixator pin sites).  Remove any crust or coagulum that may obstruct drainage with a saline moistened gauze or soap and water.  After POD 3, if there is no discernable drainage on the pin site dressing, the interval for change can by increased to every other day.  You may shower with the fixator, cleaning all pin sites gently with soap and water.  If you have a surgical wound this needs to be completely dry and without drainage before showering.  The extremity can be lifted by the fixator to facilitate wound care and transfers.  Notify the office/Doctor if you experience increasing drainage, redness, or pain from a pin site, or if you notice purulent (thick, snot-like) drainage.  Discharge Wound Care Instructions  Do NOT apply any ointments, solutions or lotions to pin sites or surgical wounds.  These prevent needed drainage and even though solutions like hydrogen peroxide kill bacteria, they also damage cells lining the pin sites that help fight infection.  Applying lotions or ointments can keep the wounds moist and can cause them to breakdown and open up as  well. This can increase the risk for infection. When in doubt call the office.  Surgical incisions should be dressed daily.  If any drainage is noted, use one layer of adaptic, then gauze, Kerlix, and an ace wrap.  Once the incision is completely dry and without drainage, it may be left open to air out.  Showering may begin 36-48 hours later.  Cleaning gently with soap and water.  Traumatic wounds should be dressed daily as well.    One layer of adaptic, gauze, Kerlix, then ace wrap.  The adaptic can be discontinued once the draining has ceased    If you have a wet to dry dressing: wet the gauze with saline the squeeze as much saline out so the gauze  is moist (not soaking wet), place moistened gauze over wound, then place a dry gauze over the moist one, followed by Kerlix wrap, then ace wrap.   Driving restrictions  As directed    Comments:     No driving for 3 weeks   Increase activity slowly as tolerated  As directed    Lifting restrictions  As directed    Comments:     No lifting       Medication List         albuterol (2.5 MG/3ML) 0.083% nebulizer solution  Commonly known as:  PROVENTIL  Take 2.5 mg by nebulization every 6 (six) hours as needed for wheezing or shortness of breath.     ALLEGRA-D 12 HOUR PO  Take 1 tablet by mouth every 12 (twelve) hours.     cholecalciferol 1000 UNITS tablet  Commonly known as:  VITAMIN D  Take 1,000 Units by mouth daily.     DULCOLAX PO  Take 75 mg by mouth 2 (two) times daily.     methocarbamol 500 MG tablet  Commonly known as:  ROBAXIN  Take 1-2 tablets (500-1,000 mg total) by mouth 4 (four) times daily as needed for muscle spasms.     oxyCODONE-acetaminophen 10-325 MG per tablet  Commonly known as:  PERCOCET  Take 1-2 tablets by mouth every 6 (six) hours as needed for pain.     PARoxetine 20 MG tablet  Commonly known as:  PAXIL  Take 20 mg by mouth daily with breakfast.           Follow-up Information   Schedule an  appointment as soon as possible for a visit with Budd Palmer, MD. (call for appointment )    Specialty:  Orthopedic Surgery   Contact information:   [address] [place] [address] [phone]       Discharge Instructions and Plan:  Patient will be discharged home today. He had fairly extensive degenerative changes to his right shoulder however we were able to breathe quite a bit of a synovitis as well as other damage soft tissue. O in addition to this we also did re-graft his nonunion site hopeful that this will go on and heal uneventfully. In addition to the indices graft we also did place resorbable antibiotic beads to address any other indolent infection that might be present. Patient can perform range of motion of his right shoulder as tolerated he will refrain from lifting anything heavier than about 5 pounds. Again we had extensive discussions about him using his firearms for the next 6-8 weeks until we are sure that his clavicle is united completely. We will check patient back in 2 weeks for reevaluation and followup x-rays of his clavicle This will be discharged on Percocet 10/325 and Robaxin for pain control Condition does not require pharmacologic DVT prophylaxis as well  Contact the office any questions or concerns. He can perform daily dressing changes to his right shoulder starting Friday and can wash with soap and water/shower once his wound is completely dry   Signed:  Mearl Latin, PA-C [facility] [phone] (P) 07/06/2013, 9:51 AM

## 2013-07-06 NOTE — OR Nursing (Signed)
Late entry by A. Niyanna Asch RN corrected the documentation of Infuse

## 2013-07-06 NOTE — Discharge Instructions (Signed)
What to eat:  For your first meals, you should eat lightly; only small meals initially.  If you do not have nausea, you may eat larger meals.  Avoid spicy, greasy and heavy food.    General Anesthesia, Adult, Care After  Refer to this sheet in the next few weeks. These instructions provide you with information on caring for yourself after your procedure. Your health care provider may also give you more specific instructions. Your treatment has been planned according to current medical practices, but problems sometimes occur. Call your health care provider if you have any problems or questions after your procedure.  WHAT TO EXPECT AFTER THE PROCEDURE  After the procedure, it is typical to experience:  Sleepiness.  Nausea and vomiting. HOME CARE INSTRUCTIONS  For the first 24 hours after general anesthesia:  Have a responsible person with you.  Do not drive a car. If you are alone, do not take public transportation.  Do not drink alcohol.  Do not take medicine that has not been prescribed by your health care provider.  Do not sign important papers or make important decisions.  You may resume a normal diet and activities as directed by your health care provider.  Change bandages (dressings) as directed.  If you have questions or problems that seem related to general anesthesia, call the hospital and ask for the anesthetist or anesthesiologist on call. SEEK MEDICAL CARE IF:  You have nausea and vomiting that continue the day after anesthesia.  You develop a rash. SEEK IMMEDIATE MEDICAL CARE IF:  You have difficulty breathing.  You have chest pain.  You have any allergic problems. Document Released: 08/24/2000 Document Revised: 01/18/2013 Document Reviewed: 12/01/2012  [facility] Patient Information 2014 [place], [place].   Orthopaedic Trauma Service Discharge Instructions   General Discharge Instructions  WEIGHT BEARING STATUS: No lifting greater than 5 pounds with right upper extremity,  no firearms until further notice  RANGE OF MOTION/ACTIVITY: Range of motion as tolerated, sling for comfort  Diet: as you were eating previously.  Can use over the counter stool softeners and bowel preparations, such as Miralax, to help with bowel movements.  Narcotics can be constipating.  Be sure to drink plenty of fluids  STOP SMOKING OR USING NICOTINE PRODUCTS!!!!  As discussed nicotine severely impairs your body's ability to heal surgical and traumatic wounds but also impairs bone healing.  Wounds and bone heal by forming microscopic blood vessels (angiogenesis) and nicotine is a vasoconstrictor (essentially, shrinks blood vessels).  Therefore, if vasoconstriction occurs to these microscopic blood vessels they essentially disappear and are unable to deliver necessary nutrients to the healing tissue.  This is one modifiable factor that you can do to dramatically increase your chances of healing your injury.    (This means no smoking, no nicotine gum, patches, etc)  DO NOT USE NONSTEROIDAL ANTI-INFLAMMATORY DRUGS (NSAID'S)  Using products such as Advil (ibuprofen), Aleve (naproxen), Motrin (ibuprofen) for additional pain control during fracture healing can delay and/or prevent the healing response.  If you would like to take over the counter (OTC) medication, Tylenol (acetaminophen) is ok.  However, some narcotic medications that are given for pain control contain acetaminophen as well. Therefore, you should not exceed more than 4000 mg of tylenol in a day if you do not have liver disease.  Also note that there are may OTC medicines, such as cold medicines and allergy medicines that my contain tylenol as well.  If you have any questions about medications and/or interactions please  ask your doctor/PA or your pharmacist.   PAIN MEDICATION USE AND EXPECTATIONS  You have likely been given narcotic medications to help control your pain.  After a traumatic event that results in an fracture (broken bone)  with or without surgery, it is ok to use narcotic pain medications to help control one's pain.  We understand that everyone responds to pain differently and each individual patient will be evaluated on a regular basis for the continued need for narcotic medications. Ideally, narcotic medication use should last no more than 6-8 weeks (coinciding with fracture healing).   As a patient it is your responsibility as well to monitor narcotic medication use and report the amount and frequency you use these medications when you come to your office visit.   We would also advise that if you are using narcotic medications, you should take a dose prior to therapy to maximize you participation.  IF YOU ARE ON NARCOTIC MEDICATIONS IT IS NOT PERMISSIBLE TO OPERATE A MOTOR VEHICLE (MOTORCYCLE/CAR/TRUCK/MOPED) OR HEAVY MACHINERY DO NOT MIX NARCOTICS WITH OTHER CNS (CENTRAL NERVOUS SYSTEM) DEPRESSANTS SUCH AS ALCOHOL       ICE AND ELEVATE INJURED/OPERATIVE EXTREMITY  Using ice and elevating the injured extremity above your heart can help with swelling and pain control.  Icing in a pulsatile fashion, such as 20 minutes on and 20 minutes off, can be followed.    Do not place ice directly on skin. Make sure there is a barrier between to skin and the ice pack.    Using frozen items such as frozen peas works well as the conform nicely to the are that needs to be iced.  USE AN ACE WRAP OR TED HOSE FOR SWELLING CONTROL  In addition to icing and elevation, Ace wraps or TED hose are used to help limit and resolve swelling.  It is recommended to use Ace wraps or TED hose until you are informed to stop.    When using Ace Wraps start the wrapping distally (farthest away from the body) and wrap proximally (closer to the body)   Example: If you had surgery on your leg or thing and you do not have a splint on, start the ace wrap at the toes and work your way up to the thigh        If you had surgery on your upper extremity and do  not have a splint on, start the ace wrap at your fingers and work your way up to the upper arm  IF YOU ARE IN A SPLINT OR CAST DO NOT REMOVE IT FOR ANY REASON   If your splint gets wet for any reason please contact the office immediately. You may shower in your splint or cast as long as you keep it dry.  This can be done by wrapping in a cast cover or garbage back (or similar)  Do Not stick any thing down your splint or cast such as pencils, money, or hangers to try and scratch yourself with.  If you feel itchy take benadryl as prescribed on the bottle for itching  IF YOU ARE IN A CAM BOOT (BLACK BOOT)  You may remove boot periodically. Perform daily dressing changes as noted below.  Wash the liner of the boot regularly and wear a sock when wearing the boot. It is recommended that you sleep in the boot until told otherwise  CALL THE OFFICE WITH ANY QUESTIONS OR CONCERTS: [phone]     Discharge Pin Site Instructions  Dress pins  daily with Kerlix roll starting on POD 2. Wrap the Kerlix so that it tamps the skin down around the pin-skin interface to prevent/limit motion of the skin relative to the pin.  (Pin-skin motion is the primary cause of pain and infection related to external fixator pin sites).  Remove any crust or coagulum that may obstruct drainage with a saline moistened gauze or soap and water.  After POD 3, if there is no discernable drainage on the pin site dressing, the interval for change can by increased to every other day.  You may shower with the fixator, cleaning all pin sites gently with soap and water.  If you have a surgical wound this needs to be completely dry and without drainage before showering.  The extremity can be lifted by the fixator to facilitate wound care and transfers.  Notify the office/Doctor if you experience increasing drainage, redness, or pain from a pin site, or if you notice purulent (thick, snot-like) drainage.  Discharge Wound Care  Instructions  Do NOT apply any ointments, solutions or lotions to pin sites or surgical wounds.  These prevent needed drainage and even though solutions like hydrogen peroxide kill bacteria, they also damage cells lining the pin sites that help fight infection.  Applying lotions or ointments can keep the wounds moist and can cause them to breakdown and open up as well. This can increase the risk for infection. When in doubt call the office.  Surgical incisions should be dressed daily.  If any drainage is noted, use one layer of adaptic, then gauze, Kerlix, and an ace wrap.  Once the incision is completely dry and without drainage, it may be left open to air out.  Showering may begin 36-48 hours later.  Cleaning gently with soap and water.  Traumatic wounds should be dressed daily as well.    One layer of adaptic, gauze, Kerlix, then ace wrap.  The adaptic can be discontinued once the draining has ceased    If you have a wet to dry dressing: wet the gauze with saline the squeeze as much saline out so the gauze is moist (not soaking wet), place moistened gauze over wound, then place a dry gauze over the moist one, followed by Kerlix wrap, then ace wrap.

## 2013-07-06 NOTE — Progress Notes (Signed)
Occupational Therapy Treatment Patient Details Name: [name] MartinezRichard [name] MRN: [id number] DOB: 01/11/1941 Today's Date: 07/06/2013 Time: [id number] OT Time Calculation (min): 13 min  OT Assessment / Plan / Recommendation  History of present illness Right shoulder  arthroscopic subacromial decompression. 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 Extensive debridement of the shoulder including SLAP tear,   OT comments  Pt instructed in & performed pendulum ex's RUE, AROM R elbow, wrist and hand @ Mod I level today. Reviewed sling use, positioning for sleeping, edema control techniques & ADL's (pt overall Min guard assist). Written handout issued and reviewed as well. Pt educated to f/u with MD [id number]:progression of program. He verbalized understanding.  Follow Up Recommendations  Supervision - Intermittent;No OT follow up    Barriers to Discharge       Equipment Recommendations  None recommended by OT    Recommendations for Other Services    Frequency     Progress towards OT Goals Progress towards OT goals: Progressing toward goals  Plan Discharge plan remains appropriate    Precautions / Restrictions Precautions Precautions: Shoulder Shoulder Interventions: Shoulder sling/immobilizer;Off for dressing/bathing/exercises Precaution Booklet Issued: Yes (comment) Precaution Comments: Handout issued and reviewed w/ pt.  Required Braces or Orthoses: Sling (For comfort) Restrictions Weight Bearing Restrictions: No   Pertinent Vitals/Pain Pt requested pain medication for R shoulder. RN aware, pt did not rate. Repositioned in bed, elevated on pillows for comfort and support to R shoulder.    ADL  Grooming: Simulated;Supervision/safety Where Assessed - Grooming: Supported standing;Unsupported standing Upper Body Dressing: Simulated;Min guard Where Assessed - Upper Body Dressing: Unsupported sitting Toilet Transfer: Simulated;Modified independent (Pt ambulating in room, Mod I level,  EOB-chair, back to bed) Toilet Transfer Method: Sit to stand Toilet Transfer Equipment: Comfort height toilet Transfers/Ambulation Related to ADLs: Pt ambulating in room today w/o c/o dizziness. Mod I - supervision all transfers ADL Comments: Pt educated and performed HEP for RUE pendulums, AROM R elbow, wrist & hand. Reviewed ADL techniques, sling use and positioning for sleeping, edema control techniques. Reviewed precautions for No lifting, pushing or pulling. Handout issued and reviewed. Pt verbalized understanding.    OT Diagnosis:    OT Problem List:   OT Treatment Interventions:     OT Goals(current goals can now be found in the care plan section) Acute Rehab OT Goals Patient Stated Goal: Home today  Visit Information  Last OT Received On: 07/06/13 History of Present Illness: Right shoulder  arthroscopic subacromial decompression. 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 Extensive debridement of the shoulder including SLAP tear,    Subjective Data      Prior Functioning       Cognition  Cognition Arousal/Alertness: Awake/alert Behavior During Therapy: WFL for tasks assessed/performed Overall Cognitive Status: Within Functional Limits for tasks assessed    Mobility  Bed Mobility Overal bed mobility: Independent Transfers Overall transfer level: Modified independent Sit to Stand: Modified independent (Device/Increase time);Supervision General transfer comment: Mod I in room, to/from bed/chair today. Recommend supervision for shower/tub transfers.    Exercises  Donning/doffing shirt without moving shoulder: Supervision/safety;Min-guard Method for sponge bathing under operated UE: Minimal assistance Donning/doffing sling/immobilizer: Supervision/safety;Min-guard Correct positioning of sling/immobilizer: Supervision/safety Pendulum exercises (written home exercise program): Modified independent ROM for elbow, wrist and digits of operated UE: Modified  independent Sling wearing schedule (on at all times/off for ADL's): Modified independent (For comfort) Positioning of UE while sleeping: Modified independent   Balance Balance Overall balance assessment: Modified Independent;No apparent balance deficits (not formally assessed)  Sitting-balance support: No upper extremity supported;Feet supported Sitting balance-Leahy Scale: Good Standing balance support: No upper extremity supported Standing balance-Leahy Scale: Good  End of Session OT - End of Session Activity Tolerance: Patient tolerated treatment well Patient left: in bed;with call bell/phone within reach  GO     [name] [name] 07/06/2013, 8:25 AM

## 2013-07-06 NOTE — Progress Notes (Signed)
Orthopaedic Trauma Service Progress Note  Subjective  Doing well, no complaints Ready to go home And controlled with oral medicine    Objective   BP 138/74  Pulse 85  Temp(Src) 98.5 F (36.9 C) (Oral)  Resp 19  SpO2 99%  Intake/Output     02/04 0701 - 02/05 0700 02/05 0701 - 02/06 0700   P.O. 480 360   I.V.     IV Piggyback 50    Total Intake 530 360   Urine 150    Blood     Total Output 150     Net +380 +360        Stool Occurrence 1 x      Labs  None   Exam  Gen: Patient is awake and alert, resting comfortably in bed, no acute distress Lungs: Clear anterior fields Cardiac: Regular rate and rhythm Abd: Soft, nontender, nondistended Ext:       Right upper extremity    Dressing c/d/i  Incision is stable, no signs of infection             Motor and sensory functions intact             Ext warm             + Radial pulse             Swelling controlled     Assessment and Plan   POD/HD#: 2     73 y/o male s/p repair of R clavicle nonunion, R shoulder arthritis, R shoulder subacromial impingement  1. R clavicle nonunion, R subacromial impingement, R shoulder arthritis s/p arthrosocpy, HW removal and repair of nonunion               Motion as tolerated             No heavy lifting             OT consult             Ice prn             Sling for comfort  2. COPD             Nebulizer as needed  3. PTSD             Home meds  4. DVT/PE prophylaxis             SDCs             No pharmacologics indicated for UEx   5. FEN             Diet as tolerated              6. Pain            PO meds  7. Dispo              discharge home today  Follow up in 2 weeks   Mearl LatinKeith W. Price [place], PA-C [facility] [phone] (P) 07/06/2013 9:42 AM

## 2013-07-09 LAB — ANAEROBIC CULTURE

## 2013-07-10 ENCOUNTER — Encounter (HOSPITAL_COMMUNITY): Payer: Self-pay | Admitting: Orthopedic Surgery

## 2015-01-29 ENCOUNTER — Encounter (HOSPITAL_COMMUNITY): Payer: Self-pay

## 2015-01-29 ENCOUNTER — Encounter (HOSPITAL_COMMUNITY)
Admission: RE | Admit: 2015-01-29 | Discharge: 2015-01-29 | Disposition: A | Discharge status: Home / Self Care | Payer: Medicare Other | Source: Ambulatory Visit | Attending: Internal Medicine | Admitting: Internal Medicine

## 2015-01-29 VITALS — BP 138/80 | HR 78 | Ht 75.0 in | Wt 193.1 lb

## 2015-01-29 DIAGNOSIS — J449 Chronic obstructive pulmonary disease, unspecified: Secondary | ICD-10-CM | POA: Insufficient documentation

## 2015-01-29 NOTE — Progress Notes (Signed)
Patient arrived for 1st visit/orientation/education at 0800. Patient was referred to [facility] by Dr. Sherril Croon due to COPD (J44.9). During orientation advised patient on arrival and appointment times what to wear, what to do before, during and after exercise. Reviewed attendance and class policy. Talked about inclement weather and class consultation policy. Pt is scheduled to return Cardiac Rehab on February 05, 2015 at 1045 am. Pt was advised to come to class 5 minutes before class starts. He was also given instructions on meeting with the dietician and attending the Family Structure classes. Pt is eager to get started. Patient was able to complete 6 minute walk test with w/c assistance and needed 02 2L [place] at 4 minutes of walk test.  Patient was measured for the equipment. Discussed equipment safety with patient. Took patient pre-anthropometric measurements. Pursed lip breathing discussed and demonstrated.  Patient also demonstrated well. Handout given about pursed lip breathing.  Patient finished visit at 0940.

## 2015-01-29 NOTE — Progress Notes (Signed)
Cardiac/Pulmonary Rehab Medication Review by a Pharmacist  Does the patient  feel that his/her medications are working for him/her?  yes  Has the patient been experiencing any side effects to the medications prescribed?  no  Does the patient measure his/her own blood pressure or blood glucose at home?  no   Does the patient have any problems obtaining medications due to transportation or finances?   no  Understanding of regimen: good Understanding of indications: good Potential of compliance: good  Questions asked to Determine Patient Understanding of Medication Regimen:  1. What is the name of the medication?  2. What is the medication used for?  3. When should it be taken?  4. How much should be taken?  5. How will you take it?  6. What side effects should you report?  Understanding Defined as: Excellent: All questions above are correct Good: Questions 1-4 are correct Fair: Questions 1-2 are correct  Poor: 1 or none of the above questions are correct   Pharmacist comments: Pt is not reporting any side effects.  No allergies reported.  Some meds were on the PTA med list that the patient is no longer taking, list has been modified to reflect current regimen.  Valrie Hart A 01/29/2015 9:01 AM

## 2015-01-29 NOTE — Patient Instructions (Signed)
Pt has finished orientation and is scheduled to return to [facility] on February 05, 2015 at 1045. Pt has been instructed to arrive to class 15 minutes early for scheduled class. Pt has been instructed to wear comfortable clothing and shoes with rubber soles. Pt has been told to take their medications 1 hour prior to coming to class.  If the patient is not going to attend class, he has been instructed to call.

## 2015-01-30 NOTE — Progress Notes (Signed)
[facility] Pulmonary Rehabilitation Baseline Outcomes Assessment   Anthropometrics:  . Height (inches): 75 . Weight (kg): 87.5 . Grip strength was measured using a Dynamometer.  The patient's highest score was a 90.  Functional Status/Exercise Capacity: . [name] [name] had a resting heart rate of 78 BPM, a resting blood pressure of 138/80, and an oxygen saturation of 92 % on 0 liters of O2.  Yassine performed a 6-minute walk test on 01/29/15.  The patient completed 1000 feet in 6 minutes with 0 rest breaks.  This quantifies 2.45 METS.   Dyspnea Measures: . The [facility] is a simple and standardized method of classifying disability in patients with COPD.  The assessment correlates disability and dyspnea.  At entrance the patient scored a 3. The scale is provided below.   0= I only get breathless with strenuous exercise. 1= I get short of breath when hurrying on level ground or walking up a slight incline. 2= On level ground, I walk slower than people of the same age because of breathlessness, or have to stop for breath when walking at my own pace. 3= I stop for breath after walking 100 yards or after a few minutes on level ground. 4=I am too breathless to leave the house or I am breathless when dressing.   . The patient completed the [facility] Shortness Of Breath Questionnaire ([facility] [place]).  This questionnaire relates activities of daily living and shortness of breath.  The score ranges from 0-120, a higher score relates to severe shortness of breath during activities of daily living. The patient's score at entrance was 49.  Quality of Life: . Ferrans and Powers Quality of Life Index Pulmonary Version is used to assess the patients satisfaction in different domains of their life; health and functioning, socioeconomic, psychological/spiritual, and family. The overall score is recorded out of 30 points.  The patient's goal is to achieve an overall score of 21 or higher.   Chalmer received a 18.97 at entrance.  . The Patient Health Questionnaire (PHQ-2) is a first step approach for the screening of depression.  If the patient scores positive on the PHQ-2 the patient should be further assessed with the PHQ-9.  The Patient Health Questionnaire (PHQ-9) assesses the degree of depression.  Depression is important to monitor and track in pulmonary patients due to its prevalence in the population.  If the patient advances to the PHQ-9 the goal is to score less than 4 on this assessment.  Jaycen scored a 1 at entrance.  Clinical Assessment Tools: . The COPD Assessment Test (CAT) is a measurement tool to quantify how much of an impact the disease has on the patient's life.  This assessment aids the Pulmonary Rehab Team in designing the patients individualized treatment plan.  A CAT score ranges from 0-40.  A score of 10 or below indicates that COPD has a low impact on the patient's life whereas a score of 30 or higher indicates a severe impact. The patient's goal is a decrease of 1 point from entrance to discharge.  Bela had a CAT score of 30 at entrance.  Nutrition: . The "Rate My Plate" is a dietary assessment that quantifies the balance of a patient's diet.  This tool allows the Pulmonary Rehab Team to key in on the areas of the patient's diet that needs improving.  The team can then focus their nutritional education on those areas.  If the patient scores 24-40, this means there are many ways they  can make their eating habits healthier, 41-57 states that there are some ways they can make their eating habits healthier and a score of 58-72 states that they are making many healthy choices.  The patient's goal is to achieve a score of 49 or higher on this assessment.  Noam scored a 38 at entrance.  Oxygen Compliance: . Patient is currently on 0-2 liters at rest, 0-2 liters at night, and 0-2 liters for exercise.  Pacer is currently using a cpap/bipap at night.  The patient is  currently compliant.  The patient states that they do not have barriers that keep them from using their oxygen.   Education: . Pamela will attend education classes during the course of Pulmonary Rehab.  Education classes that will be offered to the patient are Activities of Daily Living and Energy Conservation, Pursed Lip Breathing and Diaphragmatic Breathing, Nutrition, Exercise for the Pulmonary Patient, Warning Signs of Infection, Chronic Lung Disease, Advanced Directives, Medications, and Stress and Meditation.  The patient completed an assessment at the entrance of the program and will complete it again upon discharge to demonstrate the level of understanding provided by the educational classes.  This assessment includes 14 questions regarding all of the education topics above.  Corday achieved a score of 8/14 at entrance.  Smoking Cessation: N/A  Exercise:  Moosa will be provided with an individualized Home Exercise Prescription (HEP) at the entrance of the program.  The patient will be followed by the Pulmonary Exercise Physiologist throughout the program to assist with the progression of the frequency, intensity, time, and type of exercise. The patient's long-term goal is to be exercising 30-60 minutes, 3-5 days per week. At entrance, the patient was exercising 4-5 days at home.

## 2015-02-05 ENCOUNTER — Encounter (HOSPITAL_COMMUNITY)
Admission: RE | Admit: 2015-02-05 | Discharge: 2015-02-05 | Disposition: A | Discharge status: Home / Self Care | Payer: Medicare Other | Source: Ambulatory Visit | Attending: Internal Medicine | Admitting: Internal Medicine

## 2015-02-05 DIAGNOSIS — J449 Chronic obstructive pulmonary disease, unspecified: Secondary | ICD-10-CM | POA: Diagnosis present

## 2015-02-07 ENCOUNTER — Encounter (HOSPITAL_COMMUNITY)
Admission: RE | Admit: 2015-02-07 | Discharge: 2015-02-07 | Disposition: A | Discharge status: Home / Self Care | Payer: Medicare Other | Source: Ambulatory Visit | Attending: Internal Medicine | Admitting: Internal Medicine

## 2015-02-07 DIAGNOSIS — J449 Chronic obstructive pulmonary disease, unspecified: Secondary | ICD-10-CM | POA: Diagnosis not present

## 2015-02-08 NOTE — Progress Notes (Signed)
Pulmonary Rehabilitation Program Outcomes Report   Orientation:  01/29/15 Graduate Date:  tbd Discharge Date:  tbd # of sessions completed: 3  Pulmonologist: Vyas Family MD:  Rowan Blase Time:  1045  A.  Exercise Program:  Tolerates exercise @ 3.60 METS for 15 minutes and Walk Test Results:  Pre: 2.45 mets  B.  Mental Health:  Good mental attitude  C.  Education/Instruction/Skills  Uses Perceived Exertion Scale and/or Dyspnea Scale  Demonstrates accurate pursed lip breathing  D.  Nutrition/Weight Control/Body Composition:  Adherence to prescribed nutrition program: fair    E.  Blood Lipids   No results found for: CHOL, HDL, LDLCALC, LDLDIRECT, TRIG, CHOLHDL  F.  Lifestyle Changes:  Making positive lifestyle changes  G.  Symptoms noted with exercise:  Asymptomatic  Report Completed By:  Doretha Sou RN   Comments:  This is the patients first week progress note in [facility].

## 2015-02-12 ENCOUNTER — Encounter (HOSPITAL_COMMUNITY)
Admission: RE | Admit: 2015-02-12 | Discharge: 2015-02-12 | Disposition: A | Discharge status: Home / Self Care | Payer: Medicare Other | Source: Ambulatory Visit | Attending: Internal Medicine | Admitting: Internal Medicine

## 2015-02-12 DIAGNOSIS — J449 Chronic obstructive pulmonary disease, unspecified: Secondary | ICD-10-CM | POA: Diagnosis not present

## 2015-02-14 ENCOUNTER — Encounter (HOSPITAL_COMMUNITY)
Admission: RE | Admit: 2015-02-14 | Discharge: 2015-02-14 | Disposition: A | Discharge status: Home / Self Care | Payer: Medicare Other | Source: Ambulatory Visit | Attending: Internal Medicine | Admitting: Internal Medicine

## 2015-02-14 DIAGNOSIS — J449 Chronic obstructive pulmonary disease, unspecified: Secondary | ICD-10-CM | POA: Diagnosis not present

## 2015-02-19 ENCOUNTER — Encounter (HOSPITAL_COMMUNITY)
Admission: RE | Admit: 2015-02-19 | Discharge: 2015-02-19 | Disposition: A | Discharge status: Home / Self Care | Payer: Medicare Other | Source: Ambulatory Visit | Attending: Internal Medicine | Admitting: Internal Medicine

## 2015-02-19 DIAGNOSIS — J449 Chronic obstructive pulmonary disease, unspecified: Secondary | ICD-10-CM | POA: Diagnosis not present

## 2015-02-21 ENCOUNTER — Encounter (HOSPITAL_COMMUNITY): Admission: RE | Admit: 2015-02-21 | Payer: Medicare Other | Source: Ambulatory Visit

## 2015-02-22 NOTE — Progress Notes (Signed)
Patient came into Pulmonary Rehab today 02/21/15 c/o chest pain when he takes a breath. He said the pain woke him from his sleeping the day before and lasted all day. He took a pain pill. Still has the pain. We called Dr. Sherril Croon office to see if they could see him to rule out any Cardiac problems. They said they could see him 02/21/15 at 11:15. He did not exercise instead went to [facility] to be seen for the chest pain.

## 2015-02-26 ENCOUNTER — Encounter (HOSPITAL_COMMUNITY): Payer: Medicare Other

## 2015-02-28 ENCOUNTER — Encounter (HOSPITAL_COMMUNITY)
Admission: RE | Admit: 2015-02-28 | Discharge: 2015-02-28 | Disposition: A | Discharge status: Home / Self Care | Payer: Medicare Other | Source: Ambulatory Visit | Attending: Internal Medicine | Admitting: Internal Medicine

## 2015-02-28 DIAGNOSIS — J449 Chronic obstructive pulmonary disease, unspecified: Secondary | ICD-10-CM | POA: Diagnosis not present

## 2015-03-04 NOTE — Progress Notes (Signed)
[name] [name] 74 y.o. male  30 day Psychosocial Note  Patient psychosocial assessment reveals no barriers to participation in Pulmonary Rehab. Psychosocial areas that are currently affecting patient's rehab experience include concerns about family and personal health. Patient does continue to exhibit positive coping skills to deal with his psychosocial concerns. Offered emotional support and reassurance. Patient does feel he is making progress toward Pulmonary Rehab goals. Patient reports his health and activity level has somewhat improved in the past 30 days as evidenced by patient's report of increased ability to do more things without as much SOB. Patient reports feeling positive about current and projected progression in Pulmonary Rehab. After reviewing the patient's treatment plan, the patient is making progress toward Pulmonary Rehab goals. Patient's rate of progress toward rehab goals is fair. Plan of action to help patient continue to work towards rehab goals include support, education and progression. Will continue to monitor and evaluate progress toward psychosocial goal(s).  Goal(s) in progress: Get stronger Breathe better Help patient work toward returning to meaningful activities that improve patient's QOL and are attainable with patient's lung disease

## 2015-03-05 ENCOUNTER — Encounter (HOSPITAL_COMMUNITY): Payer: Medicare Other

## 2015-03-07 ENCOUNTER — Encounter (HOSPITAL_COMMUNITY)
Admission: RE | Admit: 2015-03-07 | Discharge: 2015-03-07 | Disposition: A | Discharge status: Home / Self Care | Payer: Medicare Other | Source: Ambulatory Visit | Attending: Internal Medicine | Admitting: Internal Medicine

## 2015-03-07 DIAGNOSIS — J449 Chronic obstructive pulmonary disease, unspecified: Secondary | ICD-10-CM | POA: Diagnosis present

## 2015-03-12 ENCOUNTER — Encounter (HOSPITAL_COMMUNITY)
Admission: RE | Admit: 2015-03-12 | Discharge: 2015-03-12 | Disposition: A | Discharge status: Home / Self Care | Payer: Medicare Other | Source: Ambulatory Visit | Attending: Internal Medicine | Admitting: Internal Medicine

## 2015-03-12 DIAGNOSIS — J449 Chronic obstructive pulmonary disease, unspecified: Secondary | ICD-10-CM | POA: Diagnosis not present

## 2015-03-14 ENCOUNTER — Encounter (HOSPITAL_COMMUNITY)
Admission: RE | Admit: 2015-03-14 | Discharge: 2015-03-14 | Disposition: A | Discharge status: Home / Self Care | Payer: Medicare Other | Source: Ambulatory Visit | Attending: Internal Medicine | Admitting: Internal Medicine

## 2015-03-14 DIAGNOSIS — J449 Chronic obstructive pulmonary disease, unspecified: Secondary | ICD-10-CM | POA: Diagnosis not present

## 2015-03-15 NOTE — Progress Notes (Signed)
Patient was given individual home exercise plan. Handout was reviewed and discussed. Long term goals were reassessed. Patient verbalized an understanding. 

## 2015-03-19 ENCOUNTER — Encounter (HOSPITAL_COMMUNITY)
Admission: RE | Admit: 2015-03-19 | Discharge: 2015-03-19 | Disposition: A | Discharge status: Home / Self Care | Payer: Medicare Other | Source: Ambulatory Visit | Attending: Internal Medicine | Admitting: Internal Medicine

## 2015-03-19 DIAGNOSIS — J449 Chronic obstructive pulmonary disease, unspecified: Secondary | ICD-10-CM | POA: Diagnosis not present

## 2015-03-21 ENCOUNTER — Encounter (HOSPITAL_COMMUNITY): Payer: Medicare Other

## 2015-03-26 ENCOUNTER — Encounter (HOSPITAL_COMMUNITY): Payer: Medicare Other

## 2015-03-28 ENCOUNTER — Encounter (HOSPITAL_COMMUNITY): Payer: Medicare Other

## 2015-04-01 NOTE — Progress Notes (Signed)
Mccormick Merdis DelayW Wallenstein 74 y.o. male 5660 day Psychosocial Note  Patient psychosocial assessment reveals no barriers to participation in Pulmonary Rehab. Psychosocial areas that are currently affecting patient's rehab experience include concerns about  emotional problems as evidenced by patient verbalizing this. Patient stated has had some PTSD lately. Patient is sometimes loud and talkative in class and verbally inappropriate but apologizes if confronted. Patient does continue to exhibit positive coping skills to deal with his psychosocial concerns. Offered emotional support and reassurance. Patient does feel he is making progress toward Pulmonary Rehab goals. Patient reports his health and activity level has improved in the past 30 days as evidenced by patient's report of increased ability to do things and walk longer. Patient states family/friends have noticed changes in his activity or mood. Patient reports feeling positive about current and projected progression in Pulmonary Rehab. After reviewing the patient's treatment plan, the patient is making progress toward Pulmonary Rehab goals. Patient's rate of progress toward rehab goals is good. Plan of action to help patient continue to work towards rehab goals include encouragement, support and education. Will continue to monitor and evaluate progress toward psychosocial goal(s).  Goal(s) in progress: Help patient work toward returning to meaningful activities that improve patient's QOL and are attainable with patient's lung disease. Breathe better Get stronger

## 2015-04-02 ENCOUNTER — Encounter (HOSPITAL_COMMUNITY)
Admission: RE | Admit: 2015-04-02 | Discharge: 2015-04-02 | Disposition: A | Discharge status: Home / Self Care | Payer: Medicare Other | Source: Ambulatory Visit | Attending: Internal Medicine | Admitting: Internal Medicine

## 2015-04-02 DIAGNOSIS — J449 Chronic obstructive pulmonary disease, unspecified: Secondary | ICD-10-CM | POA: Insufficient documentation

## 2015-04-04 ENCOUNTER — Encounter (HOSPITAL_COMMUNITY)
Admission: RE | Admit: 2015-04-04 | Discharge: 2015-04-04 | Disposition: A | Discharge status: Home / Self Care | Payer: Medicare Other | Source: Ambulatory Visit | Attending: Internal Medicine | Admitting: Internal Medicine

## 2015-04-04 DIAGNOSIS — J449 Chronic obstructive pulmonary disease, unspecified: Secondary | ICD-10-CM | POA: Diagnosis not present

## 2015-04-09 ENCOUNTER — Encounter (HOSPITAL_COMMUNITY)
Admission: RE | Admit: 2015-04-09 | Discharge: 2015-04-09 | Disposition: A | Discharge status: Home / Self Care | Payer: Medicare Other | Source: Ambulatory Visit | Attending: Internal Medicine | Admitting: Internal Medicine

## 2015-04-09 DIAGNOSIS — J449 Chronic obstructive pulmonary disease, unspecified: Secondary | ICD-10-CM | POA: Diagnosis not present

## 2015-04-11 ENCOUNTER — Encounter (HOSPITAL_COMMUNITY): Payer: Medicare Other

## 2015-04-16 ENCOUNTER — Encounter (HOSPITAL_COMMUNITY): Payer: Medicare Other

## 2015-04-18 ENCOUNTER — Encounter (HOSPITAL_COMMUNITY): Payer: Medicare Other

## 2015-04-23 ENCOUNTER — Encounter (HOSPITAL_COMMUNITY): Payer: Medicare Other

## 2015-04-25 ENCOUNTER — Encounter (HOSPITAL_COMMUNITY): Payer: Medicare Other

## 2015-04-30 ENCOUNTER — Encounter (HOSPITAL_COMMUNITY): Payer: Medicare Other

## 2015-05-02 ENCOUNTER — Encounter (HOSPITAL_COMMUNITY): Payer: Medicare Other

## 2015-05-07 ENCOUNTER — Encounter (HOSPITAL_COMMUNITY): Payer: Medicare Other

## 2015-05-09 ENCOUNTER — Encounter (HOSPITAL_COMMUNITY): Payer: Medicare Other

## 2015-05-09 NOTE — Addendum Note (Signed)
Encounter addended by: Julious PayerAlysia R Myles Mallicoat, CCT on: 05/09/2015  4:03 PM<BR>     Documentation filed: Clinical Notes

## 2015-05-09 NOTE — Outcomes Assessment (Signed)
Note completed 05/09/2015                                    [facility]                                                             Final/Discharge Outcome Results  Anthropometrics: . Height (inches): 75 . Weight (kg): 86.4 ? This is a change of -1.1kg from entrance.   Functional Status/Exercise Capacity: ? [name] [name] had a resting heart rate of 76 BPM, a resting blood pressure of 126/60, and an oxygen saturation of 93 % on 0 liters of O2.      Oxygen Compliance: . Patient is currently on 0-2 liters at rest, 0-2 liters at night, and 0-2 liters for exercise.  [name] [name] is currently using cpap/bipap at night.  Patient was compliant at beginning of program, however current compliance is unknown due to problems with equipment and abrupt discharge from program.  The patient states that they do not have barriers that keep them from using their oxygen.    ? This is a negative/no change result from entrance.    Education: . [name] [name] attended 8/13 education classes.      Smoking Cessation: N/A  Patient stopped program after completing 14 sessions. Further final/discharge outcome results are not applicable due to patient abruptly stopping program after 14 sessions.

## 2015-05-10 NOTE — Progress Notes (Signed)
04/19/2015.  Patient is quitting program.  Patient stated he is getting equipment at home and wants to try it before continuing program.  Patient is discharged from [facility].

## 2015-05-10 NOTE — Addendum Note (Signed)
Encounter addended by: Andrey Cotahristy M Danaysia Rader, RN on: 05/10/2015  3:11 PM<BR>     Documentation filed: Notes Section

## 2015-05-14 ENCOUNTER — Encounter (HOSPITAL_COMMUNITY): Payer: Medicare Other

## 2015-05-16 ENCOUNTER — Encounter (HOSPITAL_COMMUNITY): Payer: Medicare Other

## 2015-05-21 ENCOUNTER — Encounter (HOSPITAL_COMMUNITY): Payer: Medicare Other

## 2015-05-23 ENCOUNTER — Encounter (HOSPITAL_COMMUNITY): Payer: Medicare Other

## 2015-05-28 ENCOUNTER — Encounter (HOSPITAL_COMMUNITY): Payer: Medicare Other

## 2015-05-30 ENCOUNTER — Encounter (HOSPITAL_COMMUNITY): Payer: Medicare Other

## 2015-06-04 ENCOUNTER — Encounter (HOSPITAL_COMMUNITY): Payer: Medicare Other

## 2015-06-06 ENCOUNTER — Encounter (HOSPITAL_COMMUNITY): Payer: Medicare Other

## 2015-06-11 ENCOUNTER — Institutional Professional Consult (permissible substitution): Payer: Medicare Other | Admitting: Internal Medicine

## 2015-06-17 ENCOUNTER — Encounter: Payer: Self-pay | Admitting: Internal Medicine

## 2015-06-17 ENCOUNTER — Ambulatory Visit (INDEPENDENT_AMBULATORY_CARE_PROVIDER_SITE_OTHER): Payer: Medicare Other | Admitting: Internal Medicine

## 2015-06-17 VITALS — BP 160/90 | HR 85 | Ht 75.0 in | Wt 198.0 lb

## 2015-06-17 DIAGNOSIS — J449 Chronic obstructive pulmonary disease, unspecified: Secondary | ICD-10-CM | POA: Diagnosis not present

## 2015-06-17 DIAGNOSIS — J9611 Chronic respiratory failure with hypoxia: Secondary | ICD-10-CM | POA: Diagnosis not present

## 2015-06-17 MED ORDER — TIOTROPIUM BROMIDE-OLODATEROL 2.5-2.5 MCG/ACT IN AERS: 2.0000 | INHALATION_SPRAY | Freq: Every day | RESPIRATORY_TRACT | Status: DC

## 2015-06-17 MED ORDER — PREDNISONE 10 MG PO TABS: ORAL_TABLET | ORAL | Status: DC

## 2015-06-17 NOTE — Patient Instructions (Addendum)
stiolto 2 puffs each am  And stop pulmocort for now   Work on maintaining perfect inhaler technique:  relax and gently blow all the way out then take a nice smooth deep breath back in, triggering the inhaler at same time you start breathing in.  Hold for up to 5 seconds if you can. Blow out thru nose. Rinse and gargle with water when done      Only use your albuterol as a rescue medication to be used if you can't catch your breath by resting or doing a relaxed purse lip breathing pattern.  - The less you use it, the better it will work when you need it. - Ok to use up to  every 4 hours if you must but call for immediate appointment if use goes up over your usual need - Don't leave home without it !!  (think of it like the spare tire for your car)   Prednisone 10 mg take  4 each am x 2 days,   2 each am x 2 days,  1 each am x 2 days and stop   Please schedule a follow up office visit in 6 weeks  with pfts on return , call sooner if needed

## 2015-06-17 NOTE — Progress Notes (Signed)
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 Stem cell rejuivination he read about on line    06/17/2015 1st [facility] office visit/ [name] [name]  maint rx = alb qid with pulmocort  Chief Complaint  Patient presents with  . Pulmonary Consult    Self referral. Pt states dxed with COPD approx 12 yrs ago. He c/o increased SOB recently with minimal exertion such as walking from lobby to exam room today. He also c/o prod cough with clear/white sputum.    last used albuterol 4 h prior to OV   Sleeps ok on bipap per Vyas  On 2lpm on best days can still do [employer] leaning on cart  Cough worse in am but s excess/ purulent sputum or mucus plugs Says tried muliple inhalers other than alb none work    No obvious other patterns in day to day or daytime variabilty or assoc chronic cough or cp or chest tightness, subjective wheeze overt sinus or hb symptoms. No unusual exp hx or h/o childhood pna/ asthma or knowledge of premature birth.  Sleeping ok without nocturnal  or early am exacerbation  of respiratory  c/o's or need for noct saba. Also denies any obvious fluctuation of symptoms with weather or environmental changes or other aggravating or alleviating factors except as outlined above   Current Medications, Allergies, Complete Past Medical History, Past Surgical History, Family History, and Social History were reviewed in [employer] record.               Review of Systems  Constitutional: Negative for fever, chills, activity change, appetite change and unexpected weight change.  HENT: Positive for sneezing. Negative for congestion, dental problem, postnasal drip, rhinorrhea, sore throat, trouble swallowing and voice change.   Eyes:  Negative for visual disturbance.  Respiratory: Positive for cough and shortness of breath. Negative for choking.   Cardiovascular: Negative for chest pain and leg swelling.  Gastrointestinal: Negative for nausea, vomiting and abdominal pain.  Genitourinary: Negative for difficulty urinating.  Musculoskeletal: Negative for arthralgias.  Skin: Negative for rash.  Psychiatric/Behavioral: Negative for behavioral problems and confusion.       Objective:   Physical Exam  amb wm nad   Wt Readings from Last 3 Encounters:  06/17/15 198 lb (89.812 kg)  01/29/15 193 lb 1.6 oz (87.59 kg)  07/03/13 209 lb 1.6 oz (94.847 kg)    Vital signs reviewed    HEENT: nl dentition, turbinates, and oropharynx. Nl external ear canals without cough reflex   NECK :  without JVD/Nodes/TM/ nl carotid upstrokes bilaterally   LUNGS: no acc muscle use,  Barrel  contour chest with distant bs  bilaterally without cough on insp or exp maneuvers   CV:  RRR  no s3 or murmur or increase in P2, no edema   ABD:  soft and nontender with nl inspiratory excursion in the supine position. No bruits or organomegaly, bowel sounds nl  MS:  Nl gait/ ext warm without deformities, calf tenderness, cyanosis or clubbing No obvious joint restrictions   SKIN: warm and dry without lesions    NEURO:  alert, approp, nl sensorium with  no motor deficits     I personally reviewed images and  agree with radiology impression as follows:  CXR:   06/11/14 emphysema without acute finding              Assessment & Plan:

## 2015-06-18 ENCOUNTER — Encounter: Payer: Self-pay | Admitting: Internal Medicine

## 2015-06-18 DIAGNOSIS — J9611 Chronic respiratory failure with hypoxia: Secondary | ICD-10-CM | POA: Insufficient documentation

## 2015-06-18 NOTE — Assessment & Plan Note (Signed)
06/17/2015   Walked 2lpm x one lap @ 185 stopped due to  Sob but no desat walked at very fast pace  Showed very poor insight into managing copd related sob, encouraged to pace himself, purse lip breath and consider rehab p returns for pfts

## 2015-06-18 NOTE — Assessment & Plan Note (Addendum)
DDX of  difficult airways management almost all start with A and  include Adherence, Ace Inhibitors, Acid Reflux, Active Sinus Disease, Alpha 1 Antitripsin deficiency, Anxiety masquerading as Airways dz,  ABPA,  Allergy(esp in young), Aspiration (esp in elderly), Adverse effects of meds,  Active smokers, A bunch of PE's (a small clot burden can't cause this syndrome unless there is already severe underlying pulm or vascular dz with poor reserve) plus two Bs  = Bronchiectasis and Beta blocker use..and one C= CHF  Adherence is always the initial "prime suspect" and is a multilayered concern that requires a "trust but verify" approach in every patient - starting with knowing how to use medications, especially inhalers, correctly, keeping up with refills and understanding the fundamental difference between maintenance and prns vs those medications only taken for a very short course and then stopped and not refilled.  - The proper method of use, as well as anticipated side effects, of a metered-dose inhaler are discussed and demonstrated to the patient. Improved effectiveness after extensive coaching during this visit to a level of approximately 75 % from a baseline of 50 % > try stiolto respimat as has not tried it previously and doubt he has much of an inflammatory/AB component here.   ? Anxiety > avoids ex>  Deconditioning >>>  usually at the bottom of this list of usual suspects but should be much higher on this pt's based on H and P and note already on psychotropics .  > would really benefit from rehab if willing   ? Allergy/ Asthmatic component > strongly doubt but will give short burst of pred to see if helps   I had an extended discussion with the patient reviewing all relevant studies completed to date and  lasting 35 minutes of a 60 minute visit    Each maintenance medication was reviewed in detail including most importantly the difference between maintenance and prns and under what circumstances  the prns are to be triggered using an action plan format that is not reflected in the computer generated alphabetically organized AVS.    Please see instructions for details which were reviewed in writing and the patient given a copy highlighting the part that I personally wrote and discussed at today's ov.

## 2015-06-21 NOTE — Progress Notes (Addendum)
[name] [name] Delay 75 y.o. male  Initial Psychosocial Assessment  Pt psychosocial assessment reveals pt lives with their spouse. Pt is currently unemployed, disabled. Pt hobbies include playing with grandchildren. Pt reports his stress level is high. Areas of stress/anxiety include Health Family.  Pt does not exhibit signs of depression. Signs of depression include none. Pt shows good  coping skills with positive outlook . Staff offered emotional support and reassurance. Monitor and evaluate progress toward psychosocial goal(s).  Goal(s): Help patient work toward returning to meaningful activities that improve patient's QOL and are attainable with patient's lung disease Breathe better Get stronger    06/21/2015 2:41 PM

## 2015-06-21 NOTE — Addendum Note (Signed)
Encounter addended by: Andrey Cota, RN on: 06/21/2015  2:44 PM<BR>     Documentation filed: Notes Section

## 2015-06-26 NOTE — Addendum Note (Signed)
Encounter addended by: Andrey Cota, RN on: 06/26/2015  1:57 PM<BR>     Documentation filed: Clinical Notes

## 2015-06-26 NOTE — Addendum Note (Signed)
Encounter addended by: Andrey Cota, RN on: 06/26/2015  2:02 PM<BR>     Documentation filed: Notes Section

## 2015-07-08 ENCOUNTER — Ambulatory Visit (INDEPENDENT_AMBULATORY_CARE_PROVIDER_SITE_OTHER): Payer: Medicare Other | Admitting: Internal Medicine

## 2015-07-08 ENCOUNTER — Encounter: Payer: Self-pay | Admitting: Internal Medicine

## 2015-07-08 VITALS — BP 122/66 | HR 89 | Ht 73.5 in | Wt 195.4 lb

## 2015-07-08 DIAGNOSIS — J9611 Chronic respiratory failure with hypoxia: Secondary | ICD-10-CM | POA: Diagnosis not present

## 2015-07-08 DIAGNOSIS — J449 Chronic obstructive pulmonary disease, unspecified: Secondary | ICD-10-CM | POA: Diagnosis not present

## 2015-07-08 LAB — PULMONARY FUNCTION TEST
DL/VA % pred: 42 %
DL/VA: 2.05 ml/min/mmHg/L
DLCO UNC: 12.17 ml/min/mmHg
DLCO unc % pred: 32 %
FEF 25-75 POST: 0.25 L/s
FEF 25-75 Pre: 0.27 L/sec
FEF2575-%Change-Post: -9 %
FEF2575-%Pred-Post: 9 %
FEF2575-%Pred-Pre: 10 %
FEV1-%CHANGE-POST: -3 %
FEV1-%PRED-PRE: 18 %
FEV1-%Pred-Post: 18 %
FEV1-POST: 0.64 L
FEV1-Pre: 0.67 L
FEV1FVC-%Change-Post: -4 %
FEV1FVC-%PRED-PRE: 46 %
FEV6-%CHANGE-POST: -3 %
FEV6-%PRED-POST: 38 %
FEV6-%Pred-Pre: 40 %
FEV6-POST: 1.78 L
FEV6-Pre: 1.84 L
FEV6FVC-%CHANGE-POST: -3 %
FEV6FVC-%PRED-POST: 95 %
FEV6FVC-%Pred-Pre: 98 %
FVC-%Change-Post: 0 %
FVC-%Pred-Post: 40 %
FVC-%Pred-Pre: 40 %
FVC-Post: 1.98 L
FVC-Pre: 1.98 L
Post FEV1/FVC ratio: 32 %
Post FEV6/FVC ratio: 90 %
Pre FEV1/FVC ratio: 34 %
Pre FEV6/FVC Ratio: 93 %
RV % PRED: 225 %
RV: 6.16 L
TLC % PRED: 126 %
TLC: 9.82 L

## 2015-07-08 NOTE — Progress Notes (Signed)
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 Stem cell rejuivination he read about on line     History of Present Illness  06/17/2015 1st [facility] office visit/ Colman Birdwell  maint rx = alb qid with pulmocort  Chief Complaint  Patient presents with  . Pulmonary Consult    Self referral. Pt states dxed with COPD approx 12 yrs ago. He c/o increased SOB recently with minimal exertion such as walking from lobby to exam room today. He also c/o prod cough with clear/white sputum.    last used albuterol 4 h prior to OV   Sleeps ok on bipap per Vyas  On 2lpm on best days can still do [employer] leaning on cart  Cough worse in am but s excess/ purulent sputum or mucus plugs Says tried muliple inhalers other than alb none work   rec stiolto 2 puffs each am  And stop pulmocort for now  Work on maintaining perfect inhaler technique:   Only use your albuterol as a rescue medication  Prednisone 10 mg take  4 each am x 2 days,   2 each am x 2 days,  1 each am x 2 days and stop     07/08/2015  f/u ov/Jer[facility] Pain re:  GOLD IV / bipap at hs/ 2lpm and some during the day   Chief Complaint  Patient presents with  . Follow-up    w/ PFT. Pt reports breathing has improved. has very little cough.   using 02 p walking which was not what was instructed but feels a lot better on stiolto with rare saba need Doing laps around the house s 02 also   No obvious day to day or daytime variability or assoc excess/ purulent sputum or mucus plugs or hemoptysis or cp or chest tightness, subjective wheeze or overt sinus or hb symptoms. No unusual exp hx or h/o childhood pna/ asthma or knowledge of premature birth.  Sleeping ok without nocturnal  or early am exacerbation  of  respiratory  c/o's or need for noct saba. Also denies any obvious fluctuation of symptoms with weather or environmental changes or other aggravating or alleviating factors except as outlined above   Current Medications, Allergies, Complete Past Medical History, Past Surgical History, Family History, and Social History were reviewed in [employer] record.  ROS  The following are not active complaints unless bolded sore throat, dysphagia, dental problems, itching, sneezing,  nasal congestion or excess/ purulent secretions, ear ache,   fever, chills, sweats, unintended wt loss, classically pleuritic or exertional cp,  orthopnea pnd or leg swelling, presyncope, palpitations, abdominal pain, anorexia, nausea, vomiting, diarrhea  or change in bowel or bladder habits, change in stools or urine, dysuria,hematuria,  rash, arthralgias, visual complaints, headache, numbness, weakness or ataxia or problems with walking or coordination,  change in mood/affect or memory.                             Objective:   Physical Exam  amb wm nad   07/08/2015          195   06/17/15 198 lb (89.812 kg)  01/29/15 193 lb 1.6 oz (  87.59 kg)  07/03/13 209 lb 1.6 oz (94.847 kg)    Vital signs reviewed    HEENT: nl dentition, turbinates, and oropharynx. Nl external ear canals without cough reflex   NECK :  without JVD/Nodes/TM/ nl carotid upstrokes bilaterally   LUNGS: no acc muscle use,  Barrel  contour chest with distant bs  bilaterally without cough on insp or exp maneuvers   CV:  RRR  no s3 or murmur or increase in P2, no edema   ABD:  soft and nontender with nl inspiratory excursion in the supine position. No bruits or organomegaly, bowel sounds nl  MS:  Nl gait/ ext warm without deformities, calf tenderness, cyanosis or clubbing No obvious joint restrictions   SKIN: warm and dry without lesions    NEURO:  alert, approp, nl sensorium with  no motor deficits     I  personally reviewed images and agree with radiology impression as follows:  CXR:   06/11/14 emphysema without acute finding              Assessment & Plan:

## 2015-07-08 NOTE — Patient Instructions (Signed)
Wear 02 at bedtime and with activity but no need to use it at rest  Only use your albuterol as a rescue medication to be used if you can't catch your breath by resting or doing a relaxed purse lip breathing pattern.  - The less you use it, the better it will work when you need it. - Ok to use up to 2 puffs  every 4 hours if you must but call for immediate appointment if use goes up over your usual need - Don't leave home without it !!  (think of it like the spare tire for your car)   Please schedule a follow up visit in 3 months but call sooner if needed

## 2015-07-08 NOTE — Assessment & Plan Note (Signed)
06/17/2015  extensive coaching HFA effectiveness =    75% > try stiolto > improved 07/08/2015  - PFT's  07/08/2015  FEV1 0.64 (18 % ) ratio 32  p 0 % improvement from saba with DLCO  32 % corrects to 42 % for alv volume    He has very severe copd and yet is feeling so good on stiolto (by comparison as to before) he doesn't use 02 as directed - (see separate a/p)   For now best rx is therefore continue stiolto and monitor ex tol on 02 and saba use  I had an extended discussion with the patient reviewing all relevant studies completed to date and  lasting 15 to 20 minutes of a 25 minute visit    Each maintenance medication was reviewed in detail including most importantly the difference between maintenance and prns and under what circumstances the prns are to be triggered using an action plan format that is not reflected in the computer generated alphabetically organized AVS.    Please see instructions for details which were reviewed in writing and the patient given a copy highlighting the part that I personally wrote and discussed at today's ov.

## 2015-07-08 NOTE — Progress Notes (Signed)
PFT done today. 

## 2015-07-08 NOTE — Assessment & Plan Note (Addendum)
06/17/2015   Walked 2lpm x one lap @ 185 stopped due to  Sob but no desat walked at very fast pace  Stas ok today at rest RA Reinforced  prev rec:   2lpm sleeping on bipap and 2lpm walking more than across the room

## 2015-07-29 ENCOUNTER — Telehealth: Payer: Self-pay | Admitting: Internal Medicine

## 2015-07-29 MED ORDER — AZITHROMYCIN 250 MG PO TABS: ORAL_TABLET | ORAL | Status: AC

## 2015-07-29 NOTE — Telephone Encounter (Signed)
Pt is aware of MW's recommendation. Rx has been sent in. Pt will call us if he is not feeling any better by the end of the week.

## 2015-07-29 NOTE — Telephone Encounter (Signed)
Spoke with pt. States that he is having issues with mucus production. Reports runny nose, coughing and chest congestion. Mucus is green in color. Has not tried any OTC medications. Denies chest tightness, wheezing, SOB or fever. Would like MW's recommendations.  MW - please advise. Thanks.  Patient Instructions     Wear 02 at bedtime and with activity but no need to use it at rest  Only use your albuterol as a rescue medication to be used if you can't catch your breath by resting or doing a relaxed purse lip breathing pattern.  - The less you use it, the better it will work when you need it. - Ok to use up to 2 puffs every 4 hours if you must but call for immediate appointment if use goes up over your usual need - Don't leave home without it !! (think of it like the spare tire for your car)   Please schedule a follow up visit in 3 months but call sooner if needed

## 2015-07-29 NOTE — Telephone Encounter (Signed)
zpak Ov by end of week if not better

## 2015-09-10 DIAGNOSIS — M545 Low back pain: Secondary | ICD-10-CM | POA: Diagnosis not present

## 2015-09-10 DIAGNOSIS — J441 Chronic obstructive pulmonary disease with (acute) exacerbation: Secondary | ICD-10-CM | POA: Diagnosis not present

## 2015-09-10 DIAGNOSIS — Z299 Encounter for prophylactic measures, unspecified: Secondary | ICD-10-CM | POA: Diagnosis not present

## 2015-09-10 DIAGNOSIS — I10 Essential (primary) hypertension: Secondary | ICD-10-CM | POA: Diagnosis not present

## 2015-09-10 DIAGNOSIS — Z87891 Personal history of nicotine dependence: Secondary | ICD-10-CM | POA: Diagnosis not present

## 2015-10-07 ENCOUNTER — Encounter: Payer: Self-pay | Admitting: Internal Medicine

## 2015-10-07 ENCOUNTER — Ambulatory Visit (INDEPENDENT_AMBULATORY_CARE_PROVIDER_SITE_OTHER): Payer: Medicare Other | Admitting: Internal Medicine

## 2015-10-07 VITALS — BP 138/74 | HR 82 | Ht 75.0 in | Wt 207.4 lb

## 2015-10-07 DIAGNOSIS — J449 Chronic obstructive pulmonary disease, unspecified: Secondary | ICD-10-CM

## 2015-10-07 DIAGNOSIS — J9611 Chronic respiratory failure with hypoxia: Secondary | ICD-10-CM | POA: Diagnosis not present

## 2015-10-07 NOTE — Assessment & Plan Note (Signed)
06/17/2015   Walked 2lpm x one lap @ 185 stopped due to  Sob but no desat walked at very fast pace    rec as of 10/07/2015  2lpm sleeping on bipap and 2lpm walking more than across the room / titrating to sat > 90% with longer walks

## 2015-10-07 NOTE — Assessment & Plan Note (Signed)
06/17/2015  extensive coaching HFA effectiveness =    75% > try stiolto > improved 07/08/2015  - PFT's  07/08/2015  FEV1 0.64 (18 % ) ratio 32  p 0 % improvement from saba with DLCO  32 % corrects to 42 % for alv volume    Very severe copd s/p rehab with reasonable level of activity tol on laba/lama = stiolto with  proprotionate to FEV1/ apparent air trapping and bipap probably helping resting modes but should not become reliant on it daytime as the CO2 elevation is not "the enemy" here and chronically leads to better buffering capacity   I had an extended discussion with the patient reviewing all relevant studies completed to date and  lasting 15 to 20 minutes of a 25 minute visit    Each maintenance medication was reviewed in detail including most importantly the difference between maintenance and prns and under what circumstances the prns are to be triggered using an action plan format that is not reflected in the computer generated alphabetically organized AVS.    Please see instructions for details which were reviewed in writing and the patient given a copy highlighting the part that I personally wrote and discussed at today's ov.

## 2015-10-07 NOTE — Progress Notes (Signed)
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 Stem cell rejuivination he read about on line with documented GOLD IV COPD Criteria  07/08/15     History of Present Illness  06/17/2015 1st [facility] office visit/ Wert  maint rx = alb qid with pulmocort  Chief Complaint  Patient presents with  . Pulmonary Consult    Self referral. Pt states dxed with COPD approx 12 yrs ago. He c/o increased SOB recently with minimal exertion such as walking from lobby to exam room today. He also c/o prod cough with clear/white sputum.    last used albuterol 4 h prior to OV   Sleeps ok on bipap per Vyas  On 2lpm on best days can still do [employer] leaning on cart  Cough worse in am but s excess/ purulent sputum or mucus plugs Says tried muliple inhalers other than alb none work   rec stiolto 2 puffs each am  And stop pulmocort for now  Work on maintaining perfect inhaler technique:   Only use your albuterol as a rescue medication  Prednisone 10 mg take  4 each am x 2 days,   2 each am x 2 days,  1 each am x 2 days and stop.    10/07/2015  f/u ov/Wert re: COPD GOLD IV/ bipap and 2lpm hs/ maint rx stiolto Chief Complaint  Patient presents with  . Follow-up  some white mucus p rising/ does not wake him up  On 02 x 2lpm with walking slower than avg pace / able to walk big stores like [employer]  MMRC3 = can't walk 100 yards even at a slow pace at a flat grade s stopping due to sob  With sats sometime in mid 80s on 2lpm with ex  Never using any saba any more, doesn't even remember color of hfa saba Sleeping well on bibpap 2lpm/ using sometime in  Afternoons for naps    No obvious day to day or daytime variability or assoc excess/ purulent sputum or mucus plugs  or hemoptysis or cp or chest tightness, subjective wheeze or overt sinus or hb symptoms. No unusual exp hx or h/o childhood pna/ asthma or knowledge of premature birth.  Sleeping ok without nocturnal  or early am exacerbation  of respiratory  c/o's or need for noct saba. Also denies any obvious fluctuation of symptoms with weather or environmental changes or other aggravating or alleviating factors except as outlined above   Current Medications, Allergies, Complete Past Medical History, Past Surgical History, Family History, and Social History were reviewed in [employer] record.  ROS  The following are not active complaints unless bolded sore throat, dysphagia, dental problems, itching, sneezing,  nasal congestion or excess/ purulent secretions, ear ache,   fever, chills, sweats, unintended wt loss, classically pleuritic or exertional cp,  orthopnea pnd or leg swelling, presyncope, palpitations, abdominal pain, anorexia, nausea, vomiting, diarrhea  or change in bowel or bladder habits, change in stools or urine, dysuria,hematuria,  rash, arthralgias, visual complaints, headache, numbness, weakness or ataxia or problems with walking or coordination,  change in mood/affect or memory.             Objective:   Physical  Exam  amb wm nad   10/07/2015          207  07/08/2015          195   06/17/15 198 lb (89.812 kg)  01/29/15 193 lb 1.6 oz (87.59 kg)  07/03/13 209 lb 1.6 oz (94.847 kg)    Vital signs reviewed    HEENT: nl dentition, turbinates, and oropharynx. Nl external ear canals without cough reflex   NECK :  without JVD/Nodes/TM/ nl carotid upstrokes bilaterally   LUNGS: no acc muscle use,  Barrel  contour chest with distant bs  bilaterally without cough on insp or exp maneuvers   CV:  RRR  no s3 or murmur or increase in P2, no edema   ABD:  soft and nontender with nl inspiratory excursion in the supine position. No bruits or organomegaly, bowel sounds  nl  MS:  Nl gait/ ext warm without deformities, calf tenderness, cyanosis or clubbing No obvious joint restrictions   SKIN: warm and dry without lesions    NEURO:  alert, approp, nl sensorium with  no motor deficits        Assessment & Plan:

## 2015-10-07 NOTE — Patient Instructions (Addendum)
Plan A = Automatic =  Stiolto 2 pffs each am   Plan B = Backup Only use your albuterol (Ventolin) as a rescue medication to be used if you can't catch your breath by resting or doing a relaxed purse lip breathing pattern.  - The less you use it, the better it will work when you need it. - Ok to use the inhaler up to 2 puffs  every 4 hours if you must but call for appointment if use goes up over your usual need - Don't leave home without it !!  (think of it like the spare tire for your car)   Plan C = Crisis - only use your albuterol nebulizer if you first try Plan B and it fails to help > ok to use the nebulizer up to every 4 hours but if start needing it regularly call for immediate appointment   Plan D = Doctor - call me if B and C not adequate  Plan E = ER - go to ER or call 911 if all else fails     When walking goal is to keep the 02 saturation over 90%   Please schedule a follow up visit in 3 months but call sooner if needed

## 2015-12-02 ENCOUNTER — Ambulatory Visit (INDEPENDENT_AMBULATORY_CARE_PROVIDER_SITE_OTHER)
Admission: RE | Admit: 2015-12-02 | Discharge: 2015-12-02 | Disposition: A | Discharge status: Home / Self Care | Payer: Medicare Other | Source: Ambulatory Visit | Attending: Internal Medicine | Admitting: Internal Medicine

## 2015-12-02 ENCOUNTER — Ambulatory Visit (INDEPENDENT_AMBULATORY_CARE_PROVIDER_SITE_OTHER): Payer: Medicare Other | Admitting: Internal Medicine

## 2015-12-02 ENCOUNTER — Encounter: Payer: Self-pay | Admitting: Internal Medicine

## 2015-12-02 VITALS — BP 124/80 | HR 75 | Ht 74.0 in | Wt 190.8 lb

## 2015-12-02 DIAGNOSIS — R0789 Other chest pain: Secondary | ICD-10-CM | POA: Diagnosis not present

## 2015-12-02 DIAGNOSIS — R079 Chest pain, unspecified: Secondary | ICD-10-CM | POA: Diagnosis not present

## 2015-12-02 DIAGNOSIS — J9611 Chronic respiratory failure with hypoxia: Secondary | ICD-10-CM | POA: Diagnosis not present

## 2015-12-02 DIAGNOSIS — R0602 Shortness of breath: Secondary | ICD-10-CM | POA: Diagnosis not present

## 2015-12-02 DIAGNOSIS — J449 Chronic obstructive pulmonary disease, unspecified: Secondary | ICD-10-CM | POA: Diagnosis not present

## 2015-12-02 MED ORDER — AMOXICILLIN-POT CLAVULANATE 875-125 MG PO TABS: 1.0000 | ORAL_TABLET | Freq: Two times a day (BID) | ORAL | Status: DC

## 2015-12-02 NOTE — Patient Instructions (Addendum)
For pain take percocet up to one very 4 hours as needed   Wear 02 24/7 for now  Augmentin 875 mg take one pill twice daily  X 10 days - take at breakfast and supper with large glass of water.  It would help reduce the usual side effects (diarrhea and yeast infections) if you ate cultured yogurt at lunch.  Please remember to go to the   x-ray department downstairs for your tests - we will call you with the results when they are available.

## 2015-12-02 NOTE — Progress Notes (Signed)
   Subjective:  Seen by Sherene SiresWert

## 2015-12-03 ENCOUNTER — Encounter: Payer: Self-pay | Admitting: Internal Medicine

## 2015-12-03 NOTE — Progress Notes (Signed)
Patient ID: [name] [name], male   DOB: 08/07/1940, 75 y.o.   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 Stem cell rejuivination he read about on line with documented GOLD IV COPD Criteria  07/08/15     History of Present Illness  06/17/2015 1st [facility] office visit/ [name] [name]  maint rx = alb qid with pulmocort  Chief Complaint  Patient presents with  . Pulmonary Consult    Self referral. Pt states dxed with COPD approx 12 yrs ago. He c/o increased SOB recently with minimal exertion such as walking from lobby to exam room today. He also c/o prod cough with clear/white sputum.    last used albuterol 4 h prior to OV   Sleeps ok on bipap per Vyas  On 2lpm on best days can still do [employer] leaning on cart  Cough worse in am but s excess/ purulent sputum or mucus plugs Says tried muliple inhalers other than alb none work   rec stiolto 2 puffs each am  And stop pulmocort for now  Work on maintaining perfect inhaler technique:   Only use your albuterol as a rescue medication  Prednisone 10 mg take  4 each am x 2 days,   2 each am x 2 days,  1 each am x 2 days and stop.    10/07/2015  f/u ov/[name] [name] re: COPD GOLD IV/ bipap and 2lpm hs/ maint rx stiolto Chief Complaint  Patient presents with  . Follow-up  some white mucus p rising/ does not wake him up  On 02 x 2lpm with walking slower than avg pace / able to walk big stores like [employer]  MMRC3 = can't walk 100 yards even at a slow pace at a flat grade s stopping due to sob  With sats sometime in mid 80s on 2lpm with ex  Never using any saba any more, doesn't even remember color of hfa saba Sleeping well on bibpap 2lpm/ using sometime in  Afternoons for naps  rec Plan A =  Automatic =  Stiolto 2 pffs each am  Plan B = Backup Only use your albuterol (Ventolin) as a rescue medication  Plan C = Crisis - only use your albuterol nebulizer if you first try Plan B and it fails to help > ok to use the nebulizer up to every 4 hours but if start needing it regularly call for immediate appointment When walking goal is to keep the 02 saturation over 90%        12/02/2015 acute extended ov/[name] [name] re: COPD GOLD IV, new L cp x one week Chief Complaint  Patient presents with  . Acute Visit    Pt c/o CP- middle and left side of chest. Pain is constant, but seems worse when he takes in a deep breath. He also c/o increased SOB, esp with exertion and when he lies down.    since abrupt onset of cp one week ago mucus has turned dark brown /coughing more At baseline using stiolto / no need for saba hfa or neb now using neb up to 4 x daily  Also has percocet but says doesn't usually use but now taking up to 4 x daily  Also using 02 24/7 now Cp in dermatomal pattern around T 8 worse anteriorly than laterally and none posteriorly   No obvious day to day or daytime variability or assoc  mucus plugs or hemoptysis or cp or chest tightness, subjective wheeze or overt sinus or hb symptoms. No unusual exp hx or h/o childhood pna/ asthma or knowledge of premature birth.   Also denies any obvious fluctuation of symptoms with weather or environmental changes or other aggravating or alleviating factors except as outlined above   Current Medications, Allergies, Complete Past Medical History, Past Surgical History, Family History, and Social History were reviewed in [facility] electronic medical record.  ROS  The following are not active complaints unless bolded sore throat, dysphagia, dental problems, itching, sneezing,  nasal congestion or excess/ purulent secretions, ear ache,   fever, chills, sweats, unintended wt loss, classically exertional cp,  orthopnea pnd or leg swelling,  presyncope, palpitations, abdominal pain, anorexia, nausea, vomiting, diarrhea  or change in bowel or bladder habits, change in stools or urine, dysuria,hematuria,  rash, arthralgias, visual complaints, headache, numbness, weakness or ataxia or problems with walking or coordination,  change in mood/affect or memory.             Objective:   Physical Exam  amb wm nad   12/02/2015          191  10/07/2015          207  07/08/2015          195   06/17/15 198 lb (89.812 kg)  01/29/15 193 lb 1.6 oz (87.59 kg)  07/03/13 209 lb 1.6 oz (94.847 kg)    Vital signs reviewed  - note sats are 95%  At reast on 2lpm vs 92 last ov on same 02 rx   HEENT: nl dentition, turbinates, and oropharynx. Nl external ear canals without cough reflex   NECK :  without JVD/Nodes/TM/ nl carotid upstrokes bilaterally   LUNGS: no acc muscle use,  Barrel  contour chest with distant bs  bilaterally without cough on insp or exp maneuvers  Pos chest wall tenderness s obvious rib instability over L breast/ no corresponding rash or hematoma   CV:  RRR  no s3 or murmur or increase in P2, no edema   ABD:  soft and nontender with nl inspiratory excursion in the supine position. No bruits or organomegaly, bowel sounds nl  MS:  Nl gait/ ext warm without deformities, calf tenderness, cyanosis or clubbing No obvious joint restrictions   SKIN: warm and dry without lesions    NEURO:  alert, approp, nl sensorium with  no motor deficits   CXR PA and Lateral:   12/02/2015 :    I personally reviewed images and agree with radiology impression as follows:    1. No acute cardiopulmonary abnormalities. 2. Emphysema.    Sandrea HughsMichael Jawara Latorre, MD Pulmonary and Critical Care Medicine [facility] Cell [phone] After 5:30 PM or weekends, call [phone]

## 2015-12-03 NOTE — Assessment & Plan Note (Signed)
06/17/2015  extensive coaching HFA effectiveness =    75% > try stiolto > improved 07/08/2015  - PFT's  07/08/2015  FEV1 0.64 (18 % ) ratio 32  p 0 % improvement from saba with DLCO  32 % corrects to 42 % for alv volume    ? Mild flare with brown sputum but no active wheeze > rec augmentin x 10 days

## 2015-12-03 NOTE — Assessment & Plan Note (Signed)
06/17/2015   Walked 2lpm x one lap @ 185 stopped due to  Sob but no desat walked at very fast pace    rec as of 12/02/2015  2lpm sleeping on bipap and 2lpm walking more than across the room / titrating to sat > 90% with longer walks

## 2015-12-03 NOTE — Assessment & Plan Note (Addendum)
New onset pleuritic cp s explanation with gas exchange better than last ov so not likely a pulmonary source ? Cracked a rib from coughing/ no evidence pna on cxr or PE risk but if not improving on rx for MSCP needs CTa chest next to sort out  Discussed in detail all the  indications, usual  risks and alternatives  relative to the benefits with patient who agrees to proceed with conservative f/u as outlined  With increase perocet for now, rx as acute bronchitis  I had an extended discussion with the patient reviewing all relevant studies completed to date and  lasting 25 minutes of a 40  minute acute  visit    Each maintenance medication was reviewed in detail including most importantly the difference between maintenance and prns and under what circumstances the prns are to be triggered using an action plan format that is not reflected in the computer generated alphabetically organized AVS.    Please see instructions for details which were reviewed in writing and the patient given a copy highlighting the part that I personally wrote and discussed at today's ov.

## 2015-12-04 ENCOUNTER — Other Ambulatory Visit: Payer: Self-pay | Admitting: Internal Medicine

## 2015-12-04 ENCOUNTER — Other Ambulatory Visit (INDEPENDENT_AMBULATORY_CARE_PROVIDER_SITE_OTHER): Payer: Medicare Other

## 2015-12-04 ENCOUNTER — Ambulatory Visit (INDEPENDENT_AMBULATORY_CARE_PROVIDER_SITE_OTHER)
Admission: RE | Admit: 2015-12-04 | Discharge: 2015-12-04 | Disposition: A | Discharge status: Home / Self Care | Payer: Medicare Other | Source: Ambulatory Visit | Attending: Internal Medicine | Admitting: Internal Medicine

## 2015-12-04 DIAGNOSIS — R0602 Shortness of breath: Secondary | ICD-10-CM | POA: Diagnosis not present

## 2015-12-04 DIAGNOSIS — R0789 Other chest pain: Secondary | ICD-10-CM

## 2015-12-04 LAB — CBC WITH DIFFERENTIAL/PLATELET
BASOS PCT: 0.6 % (ref 0.0–3.0)
Basophils Absolute: 0 10*3/uL (ref 0.0–0.1)
EOS ABS: 0.1 10*3/uL (ref 0.0–0.7)
Eosinophils Relative: 2.1 % (ref 0.0–5.0)
HCT: 41.5 % (ref 39.0–52.0)
HEMOGLOBIN: 13.9 g/dL (ref 13.0–17.0)
LYMPHS PCT: 25.1 % (ref 12.0–46.0)
Lymphs Abs: 1.6 10*3/uL (ref 0.7–4.0)
MCHC: 33.6 g/dL (ref 30.0–36.0)
MCV: 90.8 fl (ref 78.0–100.0)
MONOS PCT: 9.8 % (ref 3.0–12.0)
Monocytes Absolute: 0.6 10*3/uL (ref 0.1–1.0)
NEUTROS ABS: 4 10*3/uL (ref 1.4–7.7)
Neutrophils Relative %: 62.4 % (ref 43.0–77.0)
PLATELETS: 186 10*3/uL (ref 150.0–400.0)
RBC: 4.57 Mil/uL (ref 4.22–5.81)
RDW: 14.5 % (ref 11.5–15.5)
WBC: 6.4 10*3/uL (ref 4.0–10.5)

## 2015-12-04 LAB — BASIC METABOLIC PANEL
BUN: 20 mg/dL (ref 6–23)
CHLORIDE: 106 meq/L (ref 96–112)
CO2: 30 meq/L (ref 19–32)
Calcium: 9.2 mg/dL (ref 8.4–10.5)
Creatinine, Ser: 1.05 mg/dL (ref 0.40–1.50)
GFR: 73.23 mL/min (ref >60.00)
Glucose, Bld: 109 mg/dL — ABNORMAL HIGH (ref 70–99)
POTASSIUM: 4.3 meq/L (ref 3.5–5.1)
SODIUM: 140 meq/L (ref 135–145)

## 2015-12-04 LAB — SEDIMENTATION RATE: Sed Rate: 3 mm/hr (ref 0–20)

## 2015-12-04 MED ORDER — IOPAMIDOL (ISOVUE-370) INJECTION 76%
80.0000 mL | Freq: Once | INTRAVENOUS | Status: AC | PRN
  Administered 2015-12-04: 80 mL via INTRAVENOUS

## 2015-12-04 NOTE — Progress Notes (Signed)
Quick Note:  Spoke with pt and notified of results per Dr. Wert. Pt verbalized understanding and denied any questions.  ______ 

## 2015-12-05 ENCOUNTER — Telehealth: Payer: Self-pay | Admitting: Internal Medicine

## 2015-12-05 NOTE — Progress Notes (Signed)
Quick Note:  LMTCB ______ 

## 2015-12-05 NOTE — Telephone Encounter (Signed)
Patient is at home now, please call back at [phone].

## 2015-12-05 NOTE — Telephone Encounter (Signed)
Basic Metabolic Panel (BMET)  Status: Finalresult Visible to patient:  Not Released Nextappt: 01/07/2016 at 10:00 AM in Pulmonology Sandrea Hughs(Michael Wert, MD) Dx:  Other chest pain          Notes Recorded by Christen ButterLeslie M Raskin, CMA on 12/05/2015 at 9:40 AM LMTCB Notes Recorded by Nyoka CowdenMichael B Wert, MD on 12/04/2015 at 5:38 PM Call patient : Studies are unremarkable, no change in recs     Result Notes     Notes Recorded by Christen ButterLeslie M Raskin, CMA on 12/05/2015 at 9:41 AM LMTCB ------  Notes Recorded by Nyoka CowdenMichael B Wert, MD on 12/04/2015 at 5:36 PM Call patient : Study is unremarkable, no explanation for cp so likely from chest wall, try heat/ continue percocet prn     Pt is aware of results. Nothing further was needed.

## 2015-12-12 DIAGNOSIS — H40033 Anatomical narrow angle, bilateral: Secondary | ICD-10-CM | POA: Diagnosis not present

## 2015-12-12 DIAGNOSIS — H2513 Age-related nuclear cataract, bilateral: Secondary | ICD-10-CM | POA: Diagnosis not present

## 2016-01-07 ENCOUNTER — Ambulatory Visit (INDEPENDENT_AMBULATORY_CARE_PROVIDER_SITE_OTHER): Payer: Medicare Other | Admitting: Internal Medicine

## 2016-01-07 ENCOUNTER — Encounter: Payer: Self-pay | Admitting: Internal Medicine

## 2016-01-07 VITALS — BP 124/70 | HR 70 | Ht 75.0 in | Wt 197.0 lb

## 2016-01-07 DIAGNOSIS — J9611 Chronic respiratory failure with hypoxia: Secondary | ICD-10-CM | POA: Diagnosis not present

## 2016-01-07 DIAGNOSIS — J449 Chronic obstructive pulmonary disease, unspecified: Secondary | ICD-10-CM

## 2016-01-07 NOTE — Patient Instructions (Addendum)
Set up appt to see Dr DeDios with your bipap machine in hand    When having a headache get back on bipap not percocet

## 2016-01-07 NOTE — Progress Notes (Signed)
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 Stem cell rejuivination he read about on line with documented GOLD IV COPD Criteria  07/08/15     History of Present Illness  06/17/2015 1st [facility] office visit/ [name] [name]  maint rx = alb qid with pulmocort  Chief Complaint  Patient presents with  . Pulmonary Consult    Self referral. Pt states dxed with COPD approx 12 yrs ago. He c/o increased SOB recently with minimal exertion such as walking from lobby to exam room today. He also c/o prod cough with clear/white sputum.    last used albuterol 4 h prior to OV   Sleeps ok on bipap per Vyas  On 2lpm on best days can still do [employer] leaning on cart  Cough worse in am but s excess/ purulent sputum or mucus plugs Says tried muliple inhalers other than alb none work   rec stiolto 2 puffs each am  And stop pulmocort for now  Work on maintaining perfect inhaler technique:   Only use your albuterol as a rescue medication  Prednisone 10 mg take  4 each am x 2 days,   2 each am x 2 days,  1 each am x 2 days and stop.    10/07/2015  f/u ov/[name] [name] re: COPD GOLD IV/ bipap and 2lpm hs/ maint rx stiolto Chief Complaint  Patient presents with  . Follow-up  some white mucus p rising/ does not wake him up  On 02 x 2lpm with walking slower than avg pace / able to walk big stores like [employer]  MMRC3 = can't walk 100 yards even at a slow pace at a flat grade s stopping due to sob  With sats sometime in mid 80s on 2lpm with ex  Never using any saba any more, doesn't even remember color of hfa saba Sleeping well on bibpap 2lpm/ using sometime in  Afternoons for naps  rec Plan A = Automatic =  Stiolto 2 pffs each am  Plan B = Backup Only use your albuterol  (Ventolin) as a rescue medication  Plan C = Crisis - only use your albuterol nebulizer if you first try Plan B and it fails to help > ok to use the nebulizer up to every 4 hours but if start needing it regularly call for immediate appointment When walking goal is to keep the 02 saturation over 90%        12/02/2015 acute extended ov/[name] [name] re: COPD GOLD IV, new L cp x one week Chief Complaint  Patient presents with  . Acute Visit    Pt c/o CP- middle and left side of chest. Pain is constant, but seems worse when he takes in a deep breath. He also c/o increased SOB, esp with exertion and when he lies down.   since abrupt onset of cp one week ago mucus has turned dark brown /coughing more At baseline using stiolto / no need for saba hfa or neb now using neb up to 4 x daily  Also has percocet but says doesn't usually use but now taking up to 4 x daily  Also using 02 24/7 now Cp in dermatomal pattern around T 8 worse anteriorly than L  laterally and none posteriorly rec For pain take percocet up to one very 4 hours as needed  Wear 02 24/7 for now Augmentin 875 mg take one pill twice daily  X 10 days - Please remember to go to the   x-ray department downstairs for your tests - we will call you with the results when they are available.    01/07/2016  f/u ov/[name] [name] re:  Copd gold IV maint rx stiolto / no saba need  Chief Complaint  Patient presents with  . Follow-up    CP still present, but now comes and goes. He states his breathing is unchanged. He has been having HA's recently that resolve when he wears his CPAP.    new ha x 6 weeks neck rad over top of head  Better first thing in am and worse as day goes on  - ha always better on cpap   Cough resolved / no more purulent mucus. Cp is fleeting and not ex or pleuritic/ no longer using percocet    No obvious day to day or daytime variability or assoc  mucus plugs or hemoptysis or cp or chest tightness, subjective wheeze or overt sinus or hb  symptoms. No unusual exp hx or h/o childhood pna/ asthma or knowledge of premature birth.   Also denies any obvious fluctuation of symptoms with weather or environmental changes or other aggravating or alleviating factors except as outlined above   Current Medications, Allergies, Complete Past Medical History, Past Surgical History, Family History, and Social History were reviewed in [facility] electronic medical record.  ROS  The following are not active complaints unless bolded sore throat, dysphagia, dental problems, itching, sneezing,  nasal congestion or excess/ purulent secretions, ear ache,   fever, chills, sweats, unintended wt loss, classically exertional cp,  orthopnea pnd or leg swelling, presyncope, palpitations, abdominal pain, anorexia, nausea, vomiting, diarrhea  or change in bowel or bladder habits, change in stools or urine, dysuria,hematuria,  rash, arthralgias, visual complaints, headache, numbness, weakness or ataxia or problems with walking or coordination,  change in mood/affect or memory.             Objective:   Physical Exam  amb wm nad on 2lpm   01/07/2016          197  12/02/2015          191  10/07/2015          207  07/08/2015          195   06/17/15 198 lb (89.812 kg)  01/29/15 193 lb 1.6 oz (87.59 kg)  07/03/13 209 lb 1.6 oz (94.847 kg)    Vital signs reviewed  -    HEENT: nl dentition, turbinates, and oropharynx. Nl external ear canals without cough reflex   NECK :  without JVD/Nodes/TM/ nl carotid upstrokes bilaterally   LUNGS: no acc muscle use,  Barrel  contour chest with very distant bs  bilaterally without cough on insp or exp maneuvers    CV:  RRR  no s3 or murmur or increase in P2, no edema   ABD:  soft and nontender with nl inspiratory excursion in the supine position. No bruits or organomegaly, bowel sounds nl  MS:  Nl gait/ ext warm without deformities, calf tenderness, cyanosis or clubbing No obvious joint restrictions   SKIN: warm  and dry without lesions    NEURO:  alert, approp, nl sensorium with  no motor deficits   CXR PA and Lateral:  12/02/2015 :    I personally reviewed images and agree with radiology impression as follows:   1. No acute cardiopulmonary abnormalities. 2. Emphysema.

## 2016-01-08 ENCOUNTER — Encounter: Payer: Self-pay | Admitting: Internal Medicine

## 2016-01-08 NOTE — Assessment & Plan Note (Signed)
  rec as of 01/07/2016  2lpm sleeping on bipap and 2lpm walking more than across the room / titrating to sat > 90% with longer walks and refer to Dr DeDios re optimizing bipap as he is developing HA when not on it typical of acute rises  in pC02 off it in that ha always improves back on bipap.

## 2016-01-08 NOTE — Assessment & Plan Note (Addendum)
06/17/2015  extensive coaching HFA effectiveness =    75% > try stiolto > improved 07/08/2015  - PFT's  07/08/2015  FEV1 0.64 (18 % ) ratio 32  p 0 % improvement from saba with DLCO  32 % corrects to 42 % for alv volume    Very severe with mostly emphysematous features typical of Group B > rec continue stiolto/ refer to Dr DeDios re bipap management (see resp failure)   I had an extended discussion with the patient reviewing all relevant studies completed to date and  lasting 15 to 20 minutes of a 25 minute visit    Each maintenance medication was reviewed in detail including most importantly the difference between maintenance and prns and under what circumstances the prns are to be triggered using an action plan format that is not reflected in the computer generated alphabetically organized AVS.    Please see instructions for details which were reviewed in writing and the patient given a copy highlighting the part that I personally wrote and discussed at today's ov.

## 2016-02-10 DIAGNOSIS — I10 Essential (primary) hypertension: Secondary | ICD-10-CM | POA: Diagnosis not present

## 2016-02-10 DIAGNOSIS — M545 Low back pain: Secondary | ICD-10-CM | POA: Diagnosis not present

## 2016-02-10 DIAGNOSIS — Z6825 Body mass index (BMI) 25.0-25.9, adult: Secondary | ICD-10-CM | POA: Diagnosis not present

## 2016-02-10 DIAGNOSIS — J449 Chronic obstructive pulmonary disease, unspecified: Secondary | ICD-10-CM | POA: Diagnosis not present

## 2016-03-04 DIAGNOSIS — Z0000 Encounter for general adult medical examination without abnormal findings: Secondary | ICD-10-CM | POA: Diagnosis not present

## 2016-03-04 DIAGNOSIS — Z7189 Other specified counseling: Secondary | ICD-10-CM | POA: Diagnosis not present

## 2016-03-04 DIAGNOSIS — Z1211 Encounter for screening for malignant neoplasm of colon: Secondary | ICD-10-CM | POA: Diagnosis not present

## 2016-03-04 DIAGNOSIS — Z299 Encounter for prophylactic measures, unspecified: Secondary | ICD-10-CM | POA: Diagnosis not present

## 2016-03-04 DIAGNOSIS — Z1389 Encounter for screening for other disorder: Secondary | ICD-10-CM | POA: Diagnosis not present

## 2016-03-04 DIAGNOSIS — Z6826 Body mass index (BMI) 26.0-26.9, adult: Secondary | ICD-10-CM | POA: Diagnosis not present

## 2016-03-05 ENCOUNTER — Ambulatory Visit (INDEPENDENT_AMBULATORY_CARE_PROVIDER_SITE_OTHER): Payer: Medicare Other | Admitting: Pulmonary Disease

## 2016-03-05 ENCOUNTER — Encounter: Payer: Self-pay | Admitting: Pulmonary Disease

## 2016-03-05 VITALS — BP 116/64 | HR 72 | Ht 75.0 in | Wt 198.2 lb

## 2016-03-05 DIAGNOSIS — J449 Chronic obstructive pulmonary disease, unspecified: Secondary | ICD-10-CM

## 2016-03-05 DIAGNOSIS — Z23 Encounter for immunization: Secondary | ICD-10-CM | POA: Diagnosis not present

## 2016-03-05 DIAGNOSIS — J9611 Chronic respiratory failure with hypoxia: Secondary | ICD-10-CM

## 2016-03-05 DIAGNOSIS — F4310 Post-traumatic stress disorder, unspecified: Secondary | ICD-10-CM | POA: Diagnosis not present

## 2016-03-05 MED ORDER — ALBUTEROL SULFATE HFA 108 (90 BASE) MCG/ACT IN AERS: 2.0000 | INHALATION_SPRAY | Freq: Four times a day (QID) | RESPIRATORY_TRACT | 6 refills | Status: DC | PRN

## 2016-03-05 NOTE — Patient Instructions (Signed)
It was a pleasure taking care of you today!  You are diagnosed with Chronic Obstructive Pulmonary Disease or COPD.  COPD is a preventable and treatable disease that makes it difficult to empty air out of the lungs (airflow obstruction).  This can lead to shortness of breath.   Sometimes, when you have a lung infection, this can make your breathing worse, and will cause you to have a COPD flare-up or an acute exacerbation of COPD. Please call your primary care doctor or the office if you are having a COPD flare-up.   Smoking makes COPD worse.   Make sure you use your medications for COPD -- Maintenance medications : Stiolto 2 puffs daily.   Rescue medications: Albuterol 2 puffs every 4 hours as needed for shortness of breath.   Please rinse your mouth each time you use your maintenance medication.  Please call the office if you are having issues with your medications  Continue using your Trilogy machine. We will try to get a download. We will order you a mask as well. We'll get a blood test.  Return to clinic in 2 months with Dr. Christene Slatese Dios

## 2016-03-05 NOTE — Assessment & Plan Note (Signed)
Pt with PTSD. Was in [place].  PTSD is stable.  On Paxil.  He is currently not seeing a psychiatrist or therapist. Wife is taking care of his PTSD. 

## 2016-03-05 NOTE — Progress Notes (Signed)
Subjective:    Patient ID: [name] [name], male    DOB: June 10, 1940, 75 y.o.   MRN: [id number]  HPI  Patient is in the office today to be seen for his NIV or Trilogy. He started seeing Dr. Sherene Sires at the start of this year for his severe COPD. He has very severe COPD, currently on Stiolto.  Pt did pulmonary rehab at [facility] from Aug 2016 until Nov 2016. Pt was admitted at [facility] at the end of last year for severe exacerbation of COPD. On discharge, he was set up on noninvasive ventilator.  Before using the noninvasive ventilator, wife noticed him having issues with breathing when he is asleep. Patient would have witnessed apneas, snoring, gasping, dyspnea during sleep. Patient also has hypersomnia and unrefreshed sleep when waking up. He started using noninvasive ventilator last year. Since that time, his breathing has gotten better. Wife denies snoring, gasping, witnessed apneas. However, his hypersomnia has persisted. Hypersomnia affects his functionality. He wakes up unrefreshed in the morning and has to nap in the afternoon. If he does not nap, he will get sleepy.  He uses 2 L oxygen with this Trilogy machine.  COPD has been stable. He has not been recently admitted nor been on antibiotics.   Pt has PTSD. Stable.  He has been on Paxil for at least 15 years. No changes in dosage.  Patient has chronic pain. Uses percocet 2x/week.   Review of Systems  Constitutional: Negative.  Negative for fever and unexpected weight change.  HENT: Positive for congestion, postnasal drip and sneezing. Negative for dental problem, ear pain, nosebleeds, rhinorrhea, sinus pressure, sore throat and trouble swallowing.   Eyes: Negative.  Negative for redness and itching.  Respiratory: Positive for cough, chest tightness, shortness of breath and wheezing.   Cardiovascular: Negative.  Negative for palpitations and leg swelling.  Gastrointestinal: Negative.  Negative for nausea and vomiting.    Endocrine: Negative.   Genitourinary: Negative.  Negative for dysuria.  Musculoskeletal: Negative.  Negative for joint swelling.  Skin: Negative.  Negative for rash.  Allergic/Immunologic: Negative.   Neurological: Positive for headaches.  Hematological: Bruises/bleeds easily.  Psychiatric/Behavioral: Negative.  Negative for dysphoric mood. The patient is not nervous/anxious.    Past Medical History:  Diagnosis Date  . Arthritis    knees and back   . COPD (chronic obstructive pulmonary disease) (HCC)    dr Duanne Guess    [place]  . Depression    ptsd   . Impingement syndrome of right shoulder 07/06/2013  . On supplemental oxygen therapy    2 liters if needed  . Pneumonia   . PTSD (post-traumatic stress disorder)    pt was a POW x 59yrs  . Shortness of breath    with exertion   . Shoulder arthritis, Right  07/06/2013    (-) CA, DVT  Family History  Problem Relation Age of Onset  . Breast cancer Mother   . Esophageal cancer Mother   . Prostate cancer Father      Past Surgical History:  Procedure Laterality Date  . APPENDECTOMY    . CARPAL TUNNEL RELEASE     right   . CERVICAL FUSION    . COLONOSCOPY    . EYE SURGERY    . FOOT SURGERY Left    no "ball" on foot  . HARDWARE REMOVAL  02/16/2012   Procedure: HARDWARE REMOVAL;  Surgeon: Budd Palmer, MD;  Location: [facility] OR;  Service: Orthopedics;  Laterality: Right;  removal of hardware from right clavicle  . HARDWARE REMOVAL Right 07/04/2013   Procedure: RIGHT HARDWARE REMOVAL FROM CLAVICAL;  Surgeon: Budd Palmer, MD;  Location: [facility] OR;  Service: Orthopedics;  Laterality: Right;  . HARVEST BONE GRAFT  02/16/2012   Procedure: HARVEST ILIAC BONE GRAFT;  Surgeon: Budd Palmer, MD;  Location: [facility] OR;  Service: Orthopedics;  Laterality: Right;  right iliac bone graft harvest  . HEMORRHOID SURGERY    . HEMORRHOID SURGERY     x 2  . JOINT REPLACEMENT  2013   left knee replaced .  [name] KNEE ARTHROSCOPY Bilateral   . NOSE SURGERY    .  ORIF CLAVICULAR FRACTURE  02/16/2012   Procedure: OPEN REDUCTION INTERNAL FIXATION (ORIF) CLAVICULAR FRACTURE;  Surgeon: Budd Palmer, MD;  Location: [facility] OR;  Service: Orthopedics;  Laterality: Right;  Removal of hardware, ORIF of Clavicle, and allograft bone harvesting.   . ORIF CLAVICULAR FRACTURE Right 12/30/2012   Procedure: RIGHT CLAVICLE NON UNION REPAIR SUBACROMICAL INJECTION ;  Surgeon: Budd Palmer, MD;  Location: [facility] OR;  Service: Orthopedics;  Laterality: Right;  . OTHER SURGICAL HISTORY     cyst removed under left ear  and under right arm   . OTHER SURGICAL HISTORY     left leg surgery   . OTHER SURGICAL HISTORY     right leg surgery   . OTHER SURGICAL HISTORY     right facial surgery   . OTHER SURGICAL HISTORY     nasal surgery   . OTHER SURGICAL HISTORY     left foot surgey and left great toe surgery   . OTHER SURGICAL HISTORY     right collar bone surgery   . PILONIDAL CYST / SINUS EXCISION    . SHOULDER ARTHROSCOPY WITH SUBACROMIAL DECOMPRESSION Right 07/04/2013   Procedure: RIGHT SHOULDER ARTHROSCOPY WITH SUBACROMIAL DECOMPRESSION ;  Surgeon: Budd Palmer, MD;  Location: [facility] OR;  Service: Orthopedics;  Laterality: Right;  . SHOULDER SURGERY Right   . SUBDURAL HEMATOMA EVACUATION VIA CRANIOTOMY    . TOTAL KNEE ARTHROPLASTY  10/19/2011   Procedure: TOTAL KNEE ARTHROPLASTY;  Surgeon: Loanne Drilling, MD;  Location: [facility] ORS;  Service: Orthopedics;  Laterality: Left;    Social History   Social History  . Marital status: Married    Spouse name: N/A  . Number of children: N/A  . Years of education: N/A   Occupational History  . Retired    Social History Main Topics  . Smoking status: Former Smoker    Packs/day: 2.00    Years: 28.00    Types: Cigarettes    Quit date: 06/02/1987  . Smokeless tobacco: Former Neurosurgeon  . Alcohol use No     Comment: quit 1989  . Drug use: No  . Sexual activity: Not on file   Other Topics Concern  . Not on file   Social History  Narrative  . No narrative on file   From [place], her in [place] x 12 yrs.   No Known Allergies   Outpatient Medications Prior to Visit  Medication Sig Dispense Refill  . diclofenac (VOLTAREN) 75 MG EC tablet Take 75 mg by mouth 2 (two) times daily.    [name] oxyCODONE-acetaminophen (PERCOCET) 10-325 MG tablet Take 1 tablet by mouth 3 (three) times daily as needed. for pain  0  . PARoxetine (PAXIL) 20 MG tablet Take 20 mg by mouth daily with breakfast.    . Tiotropium Bromide-Olodaterol (STIOLTO RESPIMAT) 2.5-2.5  MCG/ACT AERS Inhale 2 puffs into the lungs daily. 1 Inhaler 11  . UNABLE TO FIND BIPAP with 2lpm o2  O2 during the day PRN [facility]     No facility-administered medications prior to visit.    Meds ordered this encounter  Medications  . albuterol (PROVENTIL HFA;VENTOLIN HFA) 108 (90 Base) MCG/ACT inhaler    Sig: Inhale 2 puffs into the lungs every 6 (six) hours as needed for wheezing or shortness of breath.    Dispense:  1 Inhaler    Refill:  6         Objective:   Physical Exam Vitals:  Vitals:   03/05/16 1022  BP: 116/64  Pulse: 72  SpO2: 96%  Weight: 198 lb 3.2 oz (89.9 kg)  Height: 6\' 3"  (1.905 m)    Constitutional/General:  Pleasant, well-nourished, well-developed, not in any distress,  Comfortably seating.  Well kempt  Body mass index is 24.77 kg/m. Wt Readings from Last 3 Encounters:  03/05/16 198 lb 3.2 oz (89.9 kg)  01/07/16 197 lb (89.4 kg)  12/02/15 190 lb 12.8 oz (86.5 kg)     HEENT: Pupils equal and reactive to light and accommodation. Anicteric sclerae. Normal nasal mucosa.   No oral  lesions,  mouth clear,  oropharynx clear, no postnasal drip. (-) Oral thrush. No dental caries.  Airway - Mallampati class III  Neck: No masses. Midline trachea. No JVD, (-) LAD. (-) bruits appreciated.  Respiratory/Chest: Grossly normal chest. (-) deformity. (-) Accessory muscle use.  Symmetric expansion. (-) Tenderness on palpation.  Resonant on percussion.    Diminished BS on both lower lung zones. (-) wheezing, crackles, rhonchi (-) egophony  Cardiovascular: Regular rate and  rhythm, heart sounds normal, no murmur or gallops, no peripheral edema  Gastrointestinal:  Normal bowel sounds. Soft, non-tender. No hepatosplenomegaly.  (-) masses.   Musculoskeletal:  Normal muscle tone. Normal gait.   Extremities: Grossly normal. (-) clubbing, cyanosis.  (-) edema  Skin: (-) rash,lesions seen.   Neurological/Psychiatric : alert, oriented to time, place, person. Normal mood and affect           Assessment & Plan:  Chronic respiratory failure with hypoxia [facility]) Patient has chronic hypoxemic respiratory failure secondary to severe COPD. He was started on noninvasive ventilator at the end of 2016 when he was admitted for severe acute exacerbation of COPD. He uses 2 L with his noninvasive ventilator.  His COPD has done well with the noninvasive ventilator. However, he remains fatigued and tired and with hypersomnia since being on noninvasive ventilator. Patient also has leak issues with the mask. He has a full face mask which irritates his nasal bridge. The nasal pillows and nasal mass did not work since it was a lot of pressure.  Plan: 1. Plan to get a download and to get his settings from [facility]. We obtained data for the last month: He has  AVAPS-AE, set tidal volume of 450 MLS, he is on auto breath rate. Looking at  the data: Average IPAP was 10, average EPAP was 5, average breaths per minute was 15, 75% patient triggered, average tidal volume was 640 ML's. Average minute ventilation of 9.6. Compliance was 94%.  2. We called DME [facility] and according to them, patient will be getting a fit life mask (fireman mask) and hopefully this will solve his mask leak issues. If this mask work, may need to try air fir/air touch FFM or amara mask.  3.  Plan for ABG. Looking for hypercapnia. If  he has hypercapnia, may need to adjust his tidal volume or his  rate. 4. I hope changing the mask and to weaning his tidal volume or rate will improve his hypercapnia. We can always do a home or lab sleep test while he is on noninvasive ventilator. 5. He will need a nocturnal oximetry on Trilogy and 2 L. Will await the results of ABG prior to that. I'm not sure if oxygen is driving the hypercapnia.Plan to d/c O2 with NIV if possible.   COPD (chronic obstructive pulmonary disease) (HCC) PFT's  07/08/2015  FEV1 0.64 (18 % ) ratio 32,  DLCO  32 %    Cont Stiolto 2 puffs daily. Start alb MDI prn. Need to determine if alpha one has been done. Pt did pulm rehab in 2016. Cont NIV/trilogy. Cont 2L o2 with exertion and 2L with NIV. Needs ABG.  Needs flu shot/PNA shot.    PTSD (post-traumatic stress disorder) Pt with PTSD. Was in [place].  PTSD is stable.  On Paxil.  He is currently not seeing a psychiatrist or therapist. Wife is taking care of his PTSD.   Thank you very much for letting me participate in this patient's care. Please do not hesitate to give me a call if you have any questions or concerns regarding the treatment plan.   Patient will follow up with me in 2 months.     Pollie MeyerJ. Angelo A. de Dios, MD 03/05/2016   1:11 PM Pulmonary and Critical Care Medicine [facility] Pager: [phone] Office: [phone], Fax: [phone]

## 2016-03-05 NOTE — Assessment & Plan Note (Signed)
PFT's  07/08/2015  FEV1 0.64 (18 % ) ratio 32,  DLCO  32 %    Cont Stiolto 2 puffs daily. Start alb MDI prn. Need to determine if alpha one has been done. Pt did pulm rehab in 2016. Cont NIV/trilogy. Cont 2L o2 with exertion and 2L with NIV. Needs ABG.  Needs flu shot/PNA shot.

## 2016-03-05 NOTE — Assessment & Plan Note (Signed)
Patient has chronic hypoxemic respiratory failure secondary to severe COPD. He was started on noninvasive ventilator at the end of 2016 when he was admitted for severe acute exacerbation of COPD. He uses 2 L with his noninvasive ventilator.  His COPD has done well with the noninvasive ventilator. However, he remains fatigued and tired and with hypersomnia since being on noninvasive ventilator. Patient also has leak issues with the mask. He has a full face mask which irritates his nasal bridge. The nasal pillows and nasal mass did not work since it was a lot of pressure.  Plan: 1. Plan to get a download and to get his settings from [facility]. We obtained data for the last month: He has  AVAPS-AE, set tidal volume of 450 MLS, he is on auto breath rate. Looking at  the data: Average IPAP was 10, average EPAP was 5, average breaths per minute was 15, 75% patient triggered, average tidal volume was 640 ML's. Average minute ventilation of 9.6. Compliance was 94%.  2. We called D[facility] and according to them, patient will be getting a fit life mask (fireman mask) and hopefully this will solve his mask leak issues. If this mask work, may need to try air fir/air touch FFM or amara mask.  3.  Plan for ABG. Looking for hypercapnia. If he has hypercapnia, may need to adjust his tidal volume or his rate. 4. I hope changing the mask and to weaning his tidal volume or rate will improve his hypercapnia. We can always do a home or lab sleep test while he is on noninvasive ventilator. 5. He will need a nocturnal oximetry on Trilogy and 2 L. Will await the results of ABG prior to that. I'm not sure if oxygen is driving the hypercapnia.Plan to d/c O2 with NIV if possible.

## 2016-03-06 ENCOUNTER — Ambulatory Visit (HOSPITAL_COMMUNITY)
Admission: RE | Admit: 2016-03-06 | Discharge: 2016-03-06 | Disposition: A | Discharge status: Home / Self Care | Payer: Medicare Other | Source: Ambulatory Visit | Attending: Pulmonary Disease | Admitting: Pulmonary Disease

## 2016-03-06 DIAGNOSIS — Z125 Encounter for screening for malignant neoplasm of prostate: Secondary | ICD-10-CM | POA: Diagnosis not present

## 2016-03-06 DIAGNOSIS — R5383 Other fatigue: Secondary | ICD-10-CM | POA: Diagnosis not present

## 2016-03-06 DIAGNOSIS — Z79899 Other long term (current) drug therapy: Secondary | ICD-10-CM | POA: Diagnosis not present

## 2016-03-06 DIAGNOSIS — J449 Chronic obstructive pulmonary disease, unspecified: Secondary | ICD-10-CM | POA: Insufficient documentation

## 2016-03-06 LAB — BLOOD GAS, ARTERIAL
Acid-Base Excess: 3.3 mmol/L — ABNORMAL HIGH (ref 0.0–2.0)
Bicarbonate: 26.8 mmol/L (ref 20.0–28.0)
FIO2: 21
O2 SAT: 90.1 %
pCO2 arterial: 45.3 mmHg (ref 32.0–48.0)
pH, Arterial: 7.404 (ref 7.350–7.450)
pO2, Arterial: 59.8 mmHg — ABNORMAL LOW (ref 83.0–108.0)

## 2016-03-10 ENCOUNTER — Other Ambulatory Visit: Payer: Self-pay | Admitting: Pulmonary Disease

## 2016-03-10 DIAGNOSIS — J449 Chronic obstructive pulmonary disease, unspecified: Secondary | ICD-10-CM

## 2016-03-10 DIAGNOSIS — J9611 Chronic respiratory failure with hypoxia: Secondary | ICD-10-CM

## 2016-03-16 ENCOUNTER — Ambulatory Visit (INDEPENDENT_AMBULATORY_CARE_PROVIDER_SITE_OTHER): Payer: Medicare Other | Admitting: Internal Medicine

## 2016-03-16 DIAGNOSIS — J449 Chronic obstructive pulmonary disease, unspecified: Secondary | ICD-10-CM

## 2016-03-16 NOTE — Progress Notes (Signed)
Oxygen Titration Walk completed today.

## 2016-05-06 ENCOUNTER — Other Ambulatory Visit: Payer: Medicare Other

## 2016-05-06 ENCOUNTER — Ambulatory Visit (INDEPENDENT_AMBULATORY_CARE_PROVIDER_SITE_OTHER): Payer: Medicare Other | Admitting: Pulmonary Disease

## 2016-05-06 ENCOUNTER — Encounter: Payer: Self-pay | Admitting: Pulmonary Disease

## 2016-05-06 DIAGNOSIS — F4310 Post-traumatic stress disorder, unspecified: Secondary | ICD-10-CM | POA: Diagnosis not present

## 2016-05-06 DIAGNOSIS — J449 Chronic obstructive pulmonary disease, unspecified: Secondary | ICD-10-CM

## 2016-05-06 DIAGNOSIS — J9611 Chronic respiratory failure with hypoxia: Secondary | ICD-10-CM

## 2016-05-06 DIAGNOSIS — R0902 Hypoxemia: Secondary | ICD-10-CM

## 2016-05-06 MED ORDER — PREDNISONE 20 MG PO TABS: 20.0000 mg | ORAL_TABLET | Freq: Every day | ORAL | 0 refills | Status: DC

## 2016-05-06 MED ORDER — TIOTROPIUM BROMIDE-OLODATEROL 2.5-2.5 MCG/ACT IN AERS: 2.0000 | INHALATION_SPRAY | Freq: Every day | RESPIRATORY_TRACT | 0 refills | Status: DC

## 2016-05-06 MED ORDER — AZITHROMYCIN 250 MG PO TABS: ORAL_TABLET | ORAL | 0 refills | Status: DC

## 2016-05-06 NOTE — Patient Instructions (Signed)
It was a pleasure taking care of you today!  You are diagnosed with Chronic Obstructive Pulmonary Disease or COPD.  COPD is a preventable and treatable disease that makes it difficult to empty air out of the lungs (airflow obstruction).  This can lead to shortness of breath.   Sometimes, when you have a lung infection, this can make your breathing worse, and will cause you to have a COPD flare-up or an acute exacerbation of COPD. Please call your primary care doctor or the office if you are having g COPD flare-up.   Smoking makes COPD worse.   Make sure you use your medications for COPD -- Maintenance medications : Stiolto, 2 puffs daily.  Rescue medications: Albuterol 2 puffs every 4 hours as needed for shortness of breath.   Please rinse your mouth each time you use your maintenance medication.  Please call the office if you are having issues with your medications  Continue using your Non invasive machine (Trilogy) with 2 L o2.   Continue using oxygen, 2 L, keep O2 saturation > 88-90%.   Return to clinic in 3 months with Dr. Christene Slatese Dios

## 2016-05-06 NOTE — Assessment & Plan Note (Addendum)
PFT's  07/08/2015  FEV1 0.64 (18 % ) ratio 32,  DLCO  32 %   ABG 7.4/45/59 on RA  Cont Stiolto 2 puffs daily. COPD has been stable. Told patient to let us know if it starts flaring up. At that point, he will need inhaled steroids added. Cont alb MDI prn. Needs alpha one today Pt did pulm rehab in 2016. Cont NIV/trilogy. Cont 2L o2 with exertion and 2L with NIV.  Got flu shot. PNA 23 > 2015. Prevnar 13 > 05/2016. D/w him copd plan. Pred taper and abx if he starts to worsen and pt was instructed to call.

## 2016-05-06 NOTE — Assessment & Plan Note (Signed)
Cont o2 2L with NIV and rest/exertion.

## 2016-05-06 NOTE — Assessment & Plan Note (Addendum)
Patient has chronic hypoxemic respiratory failure secondary to severe COPD. He was started on noninvasive ventilator at the end of 2016 when he was admitted for severe acute exacerbation of COPD. He uses 2 L with his noninvasive ventilator.  His COPD has done well with the noninvasive ventilator. He had issues with his mask with his noninvasive ventilator. We changed mask (bigger now) and he currently loves his full face mask. Feels better using his noninvasive ventilator. Less sleepiness. Less hypersomnia. No headaches and no leak issues.  His TV was also increased to 525 ml from 475 ml as abg showed hypercapnea. Tolerating TV change.    Plan: 1. Plan to get a download from [facility] today. We got DL today but it was from July-Sept 2017.  Need more recent DL to reflect NIV changes (allegedly TV increased to 525 from 450).  2. Cont NIV with 2L o2. Will need noc ox on 2L and NIV once we get DL. I think he will need 2L given his abg findings. Feels better with NIV.

## 2016-05-06 NOTE — Assessment & Plan Note (Signed)
Pt with PTSD. Was in [place].  PTSD is stable.  On Paxil.  He is currently not seeing a psychiatrist or therapist. Wife is taking care of his PTSD.

## 2016-05-06 NOTE — Progress Notes (Signed)
Subjective:    Patient ID: [name] [name], male    DOB: 10/20/1940, 75 y.o.   MRN: [id number]  HPI  Patient is in the office today to be seen for his NIV or Trilogy. He started seeing Dr. Sherene SiresWert at the start of this year for his severe COPD. He has very severe COPD, currently on Stiolto.  Pt did pulmonary re[facility] from Aug 2016 until Nov 2016. Pt [facility] at the end of last year for severe exacerbation of COPD. On discharge, he was set up on noninvasive ventilator.  Before using the noninvasive ventilator, wife noticed him having issues with breathing when he is asleep. Patient would have witnessed apneas, snoring, gasping, dyspnea during sleep. Patient also has hypersomnia and unrefreshed sleep when waking up. He started using noninvasive ventilator last year. Since that time, his breathing has gotten better. Wife denies snoring, gasping, witnessed apneas. However, his hypersomnia has persisted. Hypersomnia affects his functionality. He wakes up unrefreshed in the morning and has to nap in the afternoon. If he does not nap, he will get sleepy.  He uses 2 L oxygen with this Trilogy machine.  COPD has been stable. He has not been recently admitted nor been on antibiotics.   Pt has PTSD. Stable.  He has been on Paxil for at least 15 years. No changes in dosage.  Patient has chronic pain. Uses percocet 2x/week.   Review of Systems  Constitutional: Negative.  Negative for fever and unexpected weight change.  HENT: Positive for congestion, postnasal drip and sneezing. Negative for dental problem, ear pain, nosebleeds, rhinorrhea, sinus pressure, sore throat and trouble swallowing.   Eyes: Negative.  Negative for redness and itching.  Respiratory: Positive for cough, chest tightness, shortness of breath and wheezing.   Cardiovascular: Negative.  Negative for palpitations and leg swelling.  Gastrointestinal: Negative.  Negative for nausea and vomiting.    Endocrine: Negative.   Genitourinary: Negative.  Negative for dysuria.  Musculoskeletal: Negative.  Negative for joint swelling.  Skin: Negative.  Negative for rash.  Allergic/Immunologic: Negative.   Neurological: Positive for headaches.  Hematological: Bruises/bleeds easily.  Psychiatric/Behavioral: Negative.  Negative for dysphoric mood. The patient is not nervous/anxious.     ROV 05/06/16 patient returns to the office as follow-up on his chronic hypoxemic respiratory failure. He was switched to a FFM and that is better. No leak issues. Headaches are better. Less SOB.  Has not been admitted nor has been on abx since last seen. Hypersomnia is better. On and off o2, usually on 2 L at rest, 2-3 L with exertion.  Has not been admitted nor been on antibiotics since last seen. On and off cough, usually better with prednisone. Usually takes prednisone 4 times a year.      Objective:   Physical Exam Vitals:  Vitals:   05/06/16 1412  BP: 122/74  Pulse: 76  SpO2: 95%  Weight: 93.1 kg (205 lb 3.2 oz)  Height: 6\' 3"  (1.905 m)    Constitutional/General:  Pleasant, well-nourished, well-developed, not in any distress,  Comfortably seating.  Well kempt Chronically ill, with 2 L oxygen via nasal cannula.  Body mass index is 25.65 kg/m. Wt Readings from Last 3 Encounters:  05/06/16 93.1 kg (205 lb 3.2 oz)  03/05/16 89.9 kg (198 lb 3.2 oz)  01/07/16 89.4 kg (197 lb)     HEENT: Pupils equal and reactive to light and accommodation. Anicteric sclerae. Normal nasal mucosa.   No oral  lesions,  mouth clear,  oropharynx clear, no postnasal drip. (-) Oral thrush. No dental caries.  Airway - Mallampati class III  Neck: No masses. Midline trachea. No JVD, (-) LAD. (-) bruits appreciated.  Respiratory/Chest: Grossly normal chest. (-) deformity. (-) Accessory muscle use.  Symmetric expansion. (-) Tenderness on palpation.  Resonant on percussion.  Diminished BS On all lung fields. (-)  wheezing, crackles, rhonchi (-) egophony  Cardiovascular: Regular rate and  rhythm, heart sounds normal, no murmur or gallops, no peripheral edema  Gastrointestinal:  Normal bowel sounds. Soft, non-tender. No hepatosplenomegaly.  (-) masses.   Musculoskeletal:  Normal muscle tone. Normal gait.   Extremities: Grossly normal. (-) clubbing, cyanosis.  (-) edema  Skin: (-) rash,lesions seen.   Neurological/Psychiatric : alert, oriented to time, place, person. Normal mood and affect           Assessment & Plan:  Chronic respiratory failure with hypoxia [facility](HCC) Patient has chronic hypoxemic respiratory failure secondary to severe COPD. He was started on noninvasive ventilator at the end of 2016 when he was admitted for severe acute exacerbation of COPD. He uses 2 L with his noninvasive ventilator.  His COPD has done well with the noninvasive ventilator. He had issues with his mask with his noninvasive ventilator. We changed mask (bigger now) and he currently loves his full face mask. Feels better using his noninvasive ventilator. Less sleepiness. Less hypersomnia. No headaches and no leak issues.  His TV was also increased to 525 ml from 475 ml as abg showed hypercapnea. Tolerating TV change.    Plan: 1. Plan to get a download from [facility] today. We got DL today but it was from July-Sept 2017.  Need more recent DL to reflect NIV changes (allegedly TV increased to 525 from 450).  2. Cont NIV with 2L o2. Will need noc ox on 2L and NIV once we get DL. I think he will need 2L given his abg findings. Feels better with NIV.    COPD (chronic obstructive pulmonary disease) (HCC) PFT's  07/08/2015  FEV1 0.64 (18 % ) ratio 32,  DLCO  32 %   ABG 7.4/45/59 on RA  Cont Stiolto 2 puffs daily. COPD has been stable. Told patient to let us know if it starts flaring up. At that point, he will need inhaled steroids added. Cont alb MDI prn. Needs alpha one today Pt did pulm rehab in 2016. Cont  NIV/trilogy. Cont 2L o2 with exertion and 2L with NIV.  Got flu shot. PNA 23 > 2015. Prevnar 13 > 05/2016. D/w him copd plan. Pred taper and abx if he starts to worsen and pt was instructed to call.    PTSD (post-traumatic stress disorder) Pt with PTSD. Was in [place].  PTSD is stable.  On Paxil.  He is currently not seeing a psychiatrist or therapist. Wife is taking care of his PTSD.  Hypoxemia Cont o2 2L with NIV and rest/exertion.      Patient will follow up with me in 3 months.     Pollie MeyerJ. Angelo A. de Dios, MD 05/07/2016   12:53 PM Pulmonary and Critical Care Medicine [facility] Pager: [phone] Office: [phone], Fax: [phone]

## 2016-05-07 ENCOUNTER — Telehealth: Payer: Self-pay | Admitting: Pulmonary Disease

## 2016-05-07 NOTE — Telephone Encounter (Signed)
   Jasmine :  We got NIV DL from [facility] on 30/812/6 but the DL was from July-Sept 65782017.  Can we ask for a more recent DL?  Maybe for November 2017?  Or December 2017?  Thanks.   Pollie MeyerJ. Angelo A de Dios, MD 05/07/2016, 12:56 PM [place] [facility] Pager [phone] After 3 pm or if no answer, call [phone]

## 2016-05-11 LAB — ALPHA-1 ANTITRYPSIN PHENOTYPE: A-1 Antitrypsin: 125 mg/dL (ref 83–199)

## 2016-05-13 NOTE — Telephone Encounter (Signed)
[facility], and they will fax over a more up to date on. Will await fax. At this time, nothing further is needed

## 2016-06-04 ENCOUNTER — Encounter: Payer: Self-pay | Admitting: Pulmonary Disease

## 2016-06-26 ENCOUNTER — Telehealth: Payer: Self-pay | Admitting: Pulmonary Disease

## 2016-06-26 NOTE — Telephone Encounter (Signed)
   NIV DL the last month: 16%88% compliant. Average IPAP 12. Average EPAP 5. Average breath 15. Average tidal volume 544. Average minute ventilation 8.3.  Cont current settings.  Pollie MeyerJ. Angelo A de Dios, MD 06/26/2016, 5:13 PM [place] [facility]monary and Critical Care Pager [phone] After 3 pm or if no answer, call [phone]

## 2016-07-08 ENCOUNTER — Encounter: Payer: Self-pay | Admitting: Pulmonary Disease

## 2016-07-29 ENCOUNTER — Other Ambulatory Visit: Payer: Self-pay | Admitting: Internal Medicine

## 2016-08-10 ENCOUNTER — Ambulatory Visit: Payer: Medicare Other | Admitting: Pulmonary Disease

## 2016-08-11 ENCOUNTER — Ambulatory Visit: Payer: Medicare Other | Admitting: Pulmonary Disease

## 2016-08-29 ENCOUNTER — Other Ambulatory Visit: Payer: Self-pay | Admitting: Internal Medicine

## 2016-09-04 ENCOUNTER — Encounter: Payer: Self-pay | Admitting: Pulmonary Disease

## 2016-09-04 ENCOUNTER — Ambulatory Visit (INDEPENDENT_AMBULATORY_CARE_PROVIDER_SITE_OTHER): Payer: Medicare Other | Admitting: Pulmonary Disease

## 2016-09-04 VITALS — BP 150/80 | HR 85 | Ht 75.0 in | Wt 269.8 lb

## 2016-09-04 DIAGNOSIS — R0609 Other forms of dyspnea: Secondary | ICD-10-CM

## 2016-09-04 DIAGNOSIS — J9611 Chronic respiratory failure with hypoxia: Secondary | ICD-10-CM

## 2016-09-04 DIAGNOSIS — R0902 Hypoxemia: Secondary | ICD-10-CM | POA: Diagnosis not present

## 2016-09-04 DIAGNOSIS — J449 Chronic obstructive pulmonary disease, unspecified: Secondary | ICD-10-CM | POA: Diagnosis not present

## 2016-09-04 DIAGNOSIS — R0602 Shortness of breath: Secondary | ICD-10-CM

## 2016-09-04 NOTE — Progress Notes (Signed)
Pt was not comfortable walking today off his oxygen for qualifying stats

## 2016-09-04 NOTE — Progress Notes (Signed)
Subjective:    Patient ID: [name] [name], male    DOB: March 21, 1941, 76 y.o.   MRN: [id number]  HPI  Patient is in the office today to be seen for his NIV or Trilogy. He started seeing Dr. Sherene Sires at the start of this year for his severe COPD. He has very severe COPD, currently on Stiolto.  Pt did pulmonary rehab at [facility] from Aug 2016 until Nov 2016. Pt was admitted at [facility] at the end of last year for severe exacerbation of COPD. On discharge, he was set up on noninvasive ventilator.  Before using the noninvasive ventilator, wife noticed him having issues with breathing when he is asleep. Patient would have witnessed apneas, snoring, gasping, dyspnea during sleep. Patient also has hypersomnia and unrefreshed sleep when waking up. He started using noninvasive ventilator last year. Since that time, his breathing has gotten better. Wife denies snoring, gasping, witnessed apneas. However, his hypersomnia has persisted. Hypersomnia affects his functionality. He wakes up unrefreshed in the morning and has to nap in the afternoon. If he does not nap, he will get sleepy.  He uses 2 L oxygen with this Trilogy machine.  COPD has been stable. He has not been recently admitted nor been on antibiotics.   Pt has PTSD. Stable.  He has been on Paxil for at least 15 years. No changes in dosage.  Patient has chronic pain. Uses percocet 2x/week.    ROV 05/06/16 patient returns to the office as follow-up on his chronic hypoxemic respiratory failure. He was switched to a FFM and that is better. No leak issues. Headaches are better. Less SOB.  Has not been admitted nor has been on abx since last seen. Hypersomnia is better. On and off o2, usually on 2 L at rest, 2-3 L with exertion.  Has not been admitted nor been on antibiotics since last seen. On and off cough, usually better with prednisone. Usually takes prednisone 4 times a year.   ROV  09/04/2016 Patient returns to the office as follow-up on  his chronic hypoxemic respiratory failure. Since last seen, we were able to get a download which showed good compliance on his noninvasive ventilator. Average IPAP was 12, average EPAP was 5, average tidal volume was 540, average backup rate was 15, average minute ventilation was 8.3. He uses 2 L during the daytime and 2.5 L with his noninvasive ventilator. Feels benefit of ventilator use. Has not had a flare last year at least. Dyspnea is stable. Uses stiolto.     Review of Systems  Constitutional: Negative.  Negative for fever and unexpected weight change.  HENT: Positive for congestion, postnasal drip and sneezing. Negative for dental problem, ear pain, nosebleeds, rhinorrhea, sinus pressure, sore throat and trouble swallowing.   Eyes: Negative.  Negative for redness and itching.  Respiratory: Positive for cough, chest tightness, shortness of breath and wheezing.   Cardiovascular: Negative.  Negative for palpitations and leg swelling.  Gastrointestinal: Negative.  Negative for nausea and vomiting.  Endocrine: Negative.   Genitourinary: Negative.  Negative for dysuria.  Musculoskeletal: Negative.  Negative for joint swelling.  Skin: Negative.  Negative for rash.  Allergic/Immunologic: Negative.   Neurological: Positive for headaches.  Hematological: Bruises/bleeds easily.  Psychiatric/Behavioral: Negative.  Negative for dysphoric mood. The patient is not nervous/anxious.        Objective:   Physical Exam Vitals:  Vitals:   09/04/16 0943  BP: (!) 150/80  Pulse: 85  SpO2: 94%  Weight: 269  lb 12.8 oz (122.4 kg)  Height:  (1.905 m)    Constitutional/General:  Pleasant, well-nourished, well-developed, not in any distress,  Comfortably seating.  Well kempt Chronically ill, with 2 L oxygen via nasal cannula.  Body mass index is 33.72 kg/m. Wt Readings from Last 3 Encounters:  09/04/16 269 lb 12.8 oz (122.4 kg)  05/06/16 205 lb 3.2 oz (93.1 kg)  03/05/16 198 lb 3.2 oz (89.9  kg)     HEENT: Pupils equal and reactive to light and accommodation. Anicteric sclerae. Normal nasal mucosa.   No oral  lesions,  mouth clear,  oropharynx clear, no postnasal drip. (-) Oral thrush. No dental caries.  Airway - Mallampati class III  Neck: No masses. Midline trachea. No JVD, (-) LAD. (-) bruits appreciated.  Respiratory/Chest: Grossly normal chest. (-) deformity. (-) Accessory muscle use.  Symmetric expansion. (-) Tenderness on palpation.  Resonant on percussion.  Very Diminished BS On all lung fields. (-) wheezing, crackles, rhonchi (-) egophony  Cardiovascular: Regular rate and  rhythm, heart sounds normal, no murmur or gallops, no peripheral edema  Gastrointestinal:  Normal bowel sounds. Soft, non-tender. No hepatosplenomegaly.  (-) masses.   Musculoskeletal:  Normal muscle tone. Normal gait.   Extremities: Grossly normal. (-) clubbing, cyanosis.  (-) edema  Skin: (-) rash,lesions seen.   Neurological/Psychiatric : alert, oriented to time, place, person. Normal mood and affect           Assessment & Plan:  Chronic respiratory failure with hypoxia [facility]) Patient has chronic hypoxemic respiratory failure secondary to severe COPD. He was started on noninvasive ventilator at the end of 2016 when he was admitted for severe acute exacerbation of COPD. He uses 2 L with his noninvasive ventilator.  His COPD has done well with the noninvasive ventilator. He had issues with his mask with his noninvasive ventilator. We changed mask (bigger now) and he currently loves his full face mask. Feels better using his noninvasive ventilator. Less sleepiness. Less hypersomnia. No headaches and no leak issues.  His TV was also increased to 525 ml from 475 ml as abg showed hypercapnea. Tolerating TV change.    Plan: 1. I'm happy with recent download. His minute ventilation is around 8. He uses his noninvasive ventilator/Trilogy with 2.5 L oxygen. Feels better using it.  Feels benefit of noninvasive ventilator use. COPD has been stable since its use. 2. Plan  for overnight oximetry on noninvasive ventilator and 2.5 L oxygen.  COPD (chronic obstructive pulmonary disease) (HCC) PFT's  07/08/2015  FEV1 0.64 (18 % ) ratio 32,  DLCO  32 %   ABG 7.4/45/59 on RA Alpha one was normal.   Cont Stiolto 2 puffs daily. COPD has been stable. Told patient to let us know if it starts flaring up. At that point, he will need inhaled steroids added. Cont alb MDI prn. Pt did pulm rehab in 2016. Cont NIV/trilogy. Cont 2L o2 with exertion and 2.5L with NIV.  Got flu shot. PNA 23 > 2015. Prevnar 13 > 05/2016. D/w him copd plan. Pred taper and abx if he starts to worsen and pt was instructed to call.    Hypoxemia Cont o2 2L with rest/exertion, 2.5 L O2 with NIV. Needs ONO.   Exertional dyspnea Patient with exertional dyspnea. He had a chest CT scan in July 2017 which showed COPD changes, possible increased markings at the bases. Repeat chest CT scan in July 2018. (R/O CA as well. )     Patient will follow  up in 6 months.     Pollie Meyer, MD 09/04/2016   11:58 AM Pulmonary and Critical Care Medicine [facility] Pager: [phone] Office: [phone], Fax: [phone]

## 2016-09-04 NOTE — Assessment & Plan Note (Signed)
PFT's  07/08/2015  FEV1 0.64 (18 % ) ratio 32,  DLCO  32 %   ABG 7.4/45/59 on RA Alpha one was normal.   Cont Stiolto 2 puffs daily. COPD has been stable. Told patient to let us know if it starts flaring up. At that point, he will need inhaled steroids added. Cont alb MDI prn. Pt did pulm rehab in 2016. Cont NIV/trilogy. Cont 2L o2 with exertion and 2.5L with NIV.  Got flu shot. PNA 23 > 2015. Prevnar 13 > 05/2016. D/w him copd plan. Pred taper and abx if he starts to worsen and pt was instructed to call.

## 2016-09-04 NOTE — Assessment & Plan Note (Signed)
Patient with exertional dyspnea. He had a chest CT scan in July 2017 which showed COPD changes, possible increased markings at the bases. Repeat chest CT scan in July 2018. (R/O CA as well. )

## 2016-09-04 NOTE — Assessment & Plan Note (Signed)
Patient has chronic hypoxemic respiratory failure secondary to severe COPD. He was started on noninvasive ventilator at the end of 2016 when he was admitted for severe acute exacerbation of COPD. He uses 2 L with his noninvasive ventilator.  His COPD has done well with the noninvasive ventilator. He had issues with his mask with his noninvasive ventilator. We changed mask (bigger now) and he currently loves his full face mask. Feels better using his noninvasive ventilator. Less sleepiness. Less hypersomnia. No headaches and no leak issues.  His TV was also increased to 525 ml from 475 ml as abg showed hypercapnea. Tolerating TV change.    Plan: 1. I'm happy with recent download. His minute ventilation is around 8. He uses his noninvasive ventilator/Trilogy with 2.5 L oxygen. Feels better using it. Feels benefit of noninvasive ventilator use. COPD has been stable since its use. 2. Plan  for overnight oximetry on noninvasive ventilator and 2.5 L oxygen.

## 2016-09-04 NOTE — Patient Instructions (Signed)
It was a pleasure taking care of you today!  You are diagnosed with Chronic Obstructive Pulmonary Disease or COPD.  COPD is a preventable and treatable disease that makes it difficult to empty air out of the lungs (airflow obstruction).  This can lead to shortness of breath.   Sometimes, when you have a lung infection, this can make your breathing worse, and will cause you to have a COPD flare-up or an acute exacerbation of COPD. Please call your primary care doctor or the office if you are having a COPD flare-up.   Smoking makes COPD worse.   Make sure you use your medications for COPD -- Maintenance medications : Stiolto 2 puffs daily.   Rescue medications: Albuterol 2 puffs every 4 hours as needed for shortness of breath.   Please rinse your mouth each time you use your maintenance medication.  Please call the office if you are having issues with your medications  We will try to get an oxygen test on your ventilator and 2.5L o2.   Return to clinic in 6 mos with NP

## 2016-09-04 NOTE — Assessment & Plan Note (Signed)
Cont o2 2L with rest/exertion, 2.5 L O2 with NIV. Needs ONO.

## 2016-09-21 DIAGNOSIS — M545 Low back pain: Secondary | ICD-10-CM | POA: Diagnosis not present

## 2016-09-21 DIAGNOSIS — Z299 Encounter for prophylactic measures, unspecified: Secondary | ICD-10-CM | POA: Diagnosis not present

## 2016-09-21 DIAGNOSIS — I10 Essential (primary) hypertension: Secondary | ICD-10-CM | POA: Diagnosis not present

## 2016-09-21 DIAGNOSIS — Z87891 Personal history of nicotine dependence: Secondary | ICD-10-CM | POA: Diagnosis not present

## 2016-09-21 DIAGNOSIS — J449 Chronic obstructive pulmonary disease, unspecified: Secondary | ICD-10-CM | POA: Diagnosis not present

## 2016-09-21 DIAGNOSIS — Z6827 Body mass index (BMI) 27.0-27.9, adult: Secondary | ICD-10-CM | POA: Diagnosis not present

## 2016-10-03 ENCOUNTER — Other Ambulatory Visit: Payer: Self-pay | Admitting: Internal Medicine

## 2016-10-10 ENCOUNTER — Other Ambulatory Visit: Payer: Self-pay | Admitting: Internal Medicine

## 2016-11-11 ENCOUNTER — Other Ambulatory Visit: Payer: Self-pay | Admitting: Internal Medicine

## 2016-12-14 ENCOUNTER — Ambulatory Visit (HOSPITAL_COMMUNITY)
Admission: RE | Admit: 2016-12-14 | Discharge: 2016-12-14 | Disposition: A | Discharge status: Home / Self Care | Payer: Medicare Other | Source: Ambulatory Visit | Attending: Pulmonary Disease | Admitting: Pulmonary Disease

## 2016-12-14 DIAGNOSIS — I2510 Atherosclerotic heart disease of native coronary artery without angina pectoris: Secondary | ICD-10-CM | POA: Diagnosis not present

## 2016-12-14 DIAGNOSIS — R0602 Shortness of breath: Secondary | ICD-10-CM | POA: Insufficient documentation

## 2016-12-14 DIAGNOSIS — I700 Atherosclerosis of aorta: Secondary | ICD-10-CM | POA: Diagnosis not present

## 2016-12-14 DIAGNOSIS — J432 Centrilobular emphysema: Secondary | ICD-10-CM | POA: Insufficient documentation

## 2016-12-29 ENCOUNTER — Other Ambulatory Visit: Payer: Self-pay | Admitting: Internal Medicine

## 2017-02-25 ENCOUNTER — Encounter: Payer: Self-pay | Admitting: Pulmonary Disease

## 2017-02-25 ENCOUNTER — Ambulatory Visit (INDEPENDENT_AMBULATORY_CARE_PROVIDER_SITE_OTHER): Payer: Medicare Other | Admitting: Pulmonary Disease

## 2017-02-25 VITALS — BP 128/74 | HR 74 | Ht 75.0 in | Wt 196.2 lb

## 2017-02-25 DIAGNOSIS — J449 Chronic obstructive pulmonary disease, unspecified: Secondary | ICD-10-CM | POA: Diagnosis not present

## 2017-02-25 DIAGNOSIS — J9611 Chronic respiratory failure with hypoxia: Secondary | ICD-10-CM

## 2017-02-25 DIAGNOSIS — G4733 Obstructive sleep apnea (adult) (pediatric): Secondary | ICD-10-CM

## 2017-02-25 DIAGNOSIS — J9612 Chronic respiratory failure with hypercapnia: Secondary | ICD-10-CM | POA: Diagnosis not present

## 2017-02-25 DIAGNOSIS — J432 Centrilobular emphysema: Secondary | ICD-10-CM | POA: Diagnosis not present

## 2017-02-25 DIAGNOSIS — Z23 Encounter for immunization: Secondary | ICD-10-CM | POA: Diagnosis not present

## 2017-02-25 NOTE — Patient Instructions (Signed)
Will get copy of Trelegy home vent report  High dose flu shot today  Follow up in 6 months

## 2017-02-25 NOTE — Progress Notes (Signed)
Current Outpatient Prescriptions on File Prior to Visit  Medication Sig  . albuterol (PROVENTIL HFA;VENTOLIN HFA) 108 (90 Base) MCG/ACT inhaler Inhale 2 puffs into the lungs every 6 (six) hours as needed for wheezing or shortness of breath.  . diclofenac (VOLTAREN) 75 MG EC tablet Take 75 mg by mouth 2 (two) times daily.  [name] PARoxetine (PAXIL) 20 MG tablet Take 20 mg by mouth daily with breakfast.  . STIOLTO RESPIMAT 2.5-2.5 MCG/ACT AERS INHALE 2 PUFFS INTO LUNGS DAILY  . UNABLE TO FIND BIPAP with 2lpm o2  O2 during the day PRN [employer]  . UNABLE TO FIND Med Name: CBD  . UNABLE TO FIND Med Name: lung clear   No current facility-administered medications on file prior to visit.      Chief Complaint  Patient presents with  . Follow-up    Pt states that he has been doing good. States that his BIPAP pressure was changed which has made a big difference. Also wears 2L O2 24/7.     Pulmonary tests PFT 07/08/15 >> FEV1 0.67 (18%), FEV1% 34, TLC 9.82 (126%), DLCO 32%, no BD A1AT 05/06/16 >> 125, MM CT chest 12/14/16 >> centrilobular emphysema  Past medical history Depression, PTSD, PNA  Past surgical history, Family history, Social history, Allergies all reviewed.  Vital Signs BP 128/74 (BP Location: Left Arm, Cuff Size: Normal)   Pulse 74   Ht  (1.905 m)   Wt 196 lb 3.2 oz (89 kg)   SpO2 95%   BMI 24.52 kg/m   History of Present Illness [name] [name] is a 76 y.o. male former smoker with COPD/emphysema, OSA, and chronic respiratory failure with hypoxia.  He was most recently followed by Dr. Christene Slates.  He has been using trelegy home vent at night.  This has helped.  He feels most recent adjustments to settings have helped.  He is getting better air at night, and feels more rested during the day.    He uses 2 liters oxygen with exertion during the day, and 3 liters at night with home vent.  He uses [employer] for his DME.  He is not having cough, wheeze, sputum, chest  pain, or leg swelling.  He denies fever, sinus congestion, or hemoptysis.  He has been using herbal medicine (Lung Clear).  This has helped.  He is still also using stiolto.  Not needing albuterol much except when he goes out in hot/humid weather.   Physical Exam  General - No distress Eyes - pupils reactive ENT - No sinus tenderness, no oral exudate, no LAN, por dentition Cardiac - s1s2 regular, no murmur Chest - No wheeze/rales/dullness Back - No focal tenderness Abd - Soft, non-tender Ext - No edema Neuro - Normal strength Skin - No rashes Psych - normal mood, and behavior   CMP Latest Ref Rng & Units 12/04/2015 07/03/2013 12/27/2012  Glucose 70 - 99 mg/dL 409(W) 119(J) 99  BUN 6 - 23 mg/dL Creatinine 0.40 - 1.50 mg/dL 4.78 2.95 6.21  Sodium 135 - 145 mEq/L 140 144 140  Potassium 3.5 - 5.1 mEq/L 4.3 4.8 4.4  Chloride 96 - 112 mEq/L 106 104 103  CO2 19 - 32 mEq/L Calcium 8.4 - 10.5 mg/dL 9.2 9.1 9.7  Total Protein 6.0 - 8.3 g/dL - 7.2 -  Total Bilirubin 0.3 - 1.2 mg/dL - 0.3 -  Alkaline Phos 39 - 117 U/L - 73 -  AST 0 - 37  U/L - 17 -  ALT 0 - 53 U/L - 11 -    CBC Latest Ref Rng & Units 12/04/2015 07/03/2013 12/27/2012  WBC 4.0 - 10.5 K/uL 6.4 7.0 7.0  Hemoglobin 13.0 - 17.0 g/dL 16.1 09.6 04.5  Hematocrit 39.0 - 52.0 % 41.5 43.6 45.3  Platelets 150.0 - 400.0 K/uL 186.0 176 169    ABG    Component Value Date/Time   PHART 7.404 03/06/2016 1431   PCO2ART 45.3 03/06/2016 1431   PO2ART 59.8 (L) 03/06/2016 1431   HCO3 26.8 03/06/2016 1431   O2SAT 90.1 03/06/2016 1431    Assessment/Plan  COPD with Emphysema. - continue stiolto and prn albuterol  Obstructive sleep apnea. - continue Trelegy home vent - will get copy of download  Chronic respiratory failure with hypoxia. - continue 2 liters O2 with exertion during the day, and 3 liters at night with trelegy   Patient Instructions  Will get copy of Trelegy home vent report  High dose flu shot  today  Follow up in 6 months  Time spent 27 minutes  Coralyn Helling, MD [place] Pulmonary/Critical Care/Sleep Pager:  [phone] 02/25/2017, 10:31 AM

## 2017-03-02 ENCOUNTER — Telehealth: Payer: Self-pay | Admitting: Pulmonary Disease

## 2017-03-02 NOTE — Telephone Encounter (Signed)
Spoke with pt, aware of results/recs.  States he recently went to DME and got a larger mask, which has resolved his mask leak.   Nothing further needed.

## 2017-03-02 NOTE — Telephone Encounter (Signed)
LVM on machine to return call back regarding trilogy report results. Will try to call back again today.

## 2017-03-02 NOTE — Telephone Encounter (Signed)
Pt returned phone call, pt contact # [phone]

## 2017-03-02 NOTE — Telephone Encounter (Signed)
Trilogy 01/07/17 to 02/04/17 >> used on 29 of 29 nights with average 9 hrs 23 min.  Average AHI 7 with IPAP range 13 to 21 cm H2O, EPAP range 5 to 7 cm H2O   Please let him know the Trilogy report looked good, but he appears to be having some leak from his mask.  He should check with his home care company about getting proper fitting mask.

## 2017-03-08 ENCOUNTER — Ambulatory Visit: Payer: Medicare Other | Admitting: Adult Health

## 2017-03-08 DIAGNOSIS — Z713 Dietary counseling and surveillance: Secondary | ICD-10-CM | POA: Diagnosis not present

## 2017-03-08 DIAGNOSIS — I10 Essential (primary) hypertension: Secondary | ICD-10-CM | POA: Diagnosis not present

## 2017-03-08 DIAGNOSIS — Z0000 Encounter for general adult medical examination without abnormal findings: Secondary | ICD-10-CM | POA: Diagnosis not present

## 2017-03-08 DIAGNOSIS — Z7189 Other specified counseling: Secondary | ICD-10-CM | POA: Diagnosis not present

## 2017-03-08 DIAGNOSIS — J449 Chronic obstructive pulmonary disease, unspecified: Secondary | ICD-10-CM | POA: Diagnosis not present

## 2017-03-08 DIAGNOSIS — Z1331 Encounter for screening for depression: Secondary | ICD-10-CM | POA: Diagnosis not present

## 2017-03-08 DIAGNOSIS — R5383 Other fatigue: Secondary | ICD-10-CM | POA: Diagnosis not present

## 2017-03-08 DIAGNOSIS — Z1339 Encounter for screening examination for other mental health and behavioral disorders: Secondary | ICD-10-CM | POA: Diagnosis not present

## 2017-03-08 DIAGNOSIS — Z6827 Body mass index (BMI) 27.0-27.9, adult: Secondary | ICD-10-CM | POA: Diagnosis not present

## 2017-03-08 DIAGNOSIS — Z299 Encounter for prophylactic measures, unspecified: Secondary | ICD-10-CM | POA: Diagnosis not present

## 2017-03-08 DIAGNOSIS — Z79899 Other long term (current) drug therapy: Secondary | ICD-10-CM | POA: Diagnosis not present

## 2017-03-08 DIAGNOSIS — M545 Low back pain: Secondary | ICD-10-CM | POA: Diagnosis not present

## 2017-03-10 DIAGNOSIS — I10 Essential (primary) hypertension: Secondary | ICD-10-CM | POA: Diagnosis not present

## 2017-03-10 DIAGNOSIS — Z79899 Other long term (current) drug therapy: Secondary | ICD-10-CM | POA: Diagnosis not present

## 2017-03-10 DIAGNOSIS — Z125 Encounter for screening for malignant neoplasm of prostate: Secondary | ICD-10-CM | POA: Diagnosis not present

## 2017-03-10 DIAGNOSIS — R5383 Other fatigue: Secondary | ICD-10-CM | POA: Diagnosis not present

## 2017-03-21 ENCOUNTER — Other Ambulatory Visit: Payer: Self-pay | Admitting: Internal Medicine

## 2017-07-14 DIAGNOSIS — M545 Low back pain: Secondary | ICD-10-CM | POA: Diagnosis not present

## 2017-07-14 DIAGNOSIS — Z299 Encounter for prophylactic measures, unspecified: Secondary | ICD-10-CM | POA: Diagnosis not present

## 2017-07-14 DIAGNOSIS — J449 Chronic obstructive pulmonary disease, unspecified: Secondary | ICD-10-CM | POA: Diagnosis not present

## 2017-07-14 DIAGNOSIS — Z87891 Personal history of nicotine dependence: Secondary | ICD-10-CM | POA: Diagnosis not present

## 2017-07-14 DIAGNOSIS — Z79899 Other long term (current) drug therapy: Secondary | ICD-10-CM | POA: Diagnosis not present

## 2017-07-14 DIAGNOSIS — I10 Essential (primary) hypertension: Secondary | ICD-10-CM | POA: Diagnosis not present

## 2017-07-14 DIAGNOSIS — Z6827 Body mass index (BMI) 27.0-27.9, adult: Secondary | ICD-10-CM | POA: Diagnosis not present

## 2017-08-03 DIAGNOSIS — Z79899 Other long term (current) drug therapy: Secondary | ICD-10-CM | POA: Diagnosis not present

## 2017-08-03 DIAGNOSIS — M47892 Other spondylosis, cervical region: Secondary | ICD-10-CM | POA: Diagnosis present

## 2017-08-03 DIAGNOSIS — Z87891 Personal history of nicotine dependence: Secondary | ICD-10-CM | POA: Diagnosis not present

## 2017-08-03 DIAGNOSIS — J449 Chronic obstructive pulmonary disease, unspecified: Secondary | ICD-10-CM | POA: Diagnosis not present

## 2017-08-03 DIAGNOSIS — J441 Chronic obstructive pulmonary disease with (acute) exacerbation: Secondary | ICD-10-CM | POA: Diagnosis present

## 2017-08-03 DIAGNOSIS — R079 Chest pain, unspecified: Secondary | ICD-10-CM | POA: Diagnosis not present

## 2017-08-03 DIAGNOSIS — M170 Bilateral primary osteoarthritis of knee: Secondary | ICD-10-CM | POA: Diagnosis not present

## 2017-08-03 DIAGNOSIS — R0789 Other chest pain: Secondary | ICD-10-CM | POA: Diagnosis not present

## 2017-08-03 DIAGNOSIS — Z79891 Long term (current) use of opiate analgesic: Secondary | ICD-10-CM | POA: Diagnosis not present

## 2017-08-03 DIAGNOSIS — F4310 Post-traumatic stress disorder, unspecified: Secondary | ICD-10-CM | POA: Diagnosis not present

## 2017-08-03 DIAGNOSIS — R091 Pleurisy: Secondary | ICD-10-CM | POA: Diagnosis not present

## 2017-08-12 DIAGNOSIS — R079 Chest pain, unspecified: Secondary | ICD-10-CM | POA: Diagnosis not present

## 2017-08-12 DIAGNOSIS — I10 Essential (primary) hypertension: Secondary | ICD-10-CM | POA: Diagnosis not present

## 2017-08-12 DIAGNOSIS — Z6827 Body mass index (BMI) 27.0-27.9, adult: Secondary | ICD-10-CM | POA: Diagnosis not present

## 2017-08-12 DIAGNOSIS — Z09 Encounter for follow-up examination after completed treatment for conditions other than malignant neoplasm: Secondary | ICD-10-CM | POA: Diagnosis not present

## 2017-08-12 DIAGNOSIS — J449 Chronic obstructive pulmonary disease, unspecified: Secondary | ICD-10-CM | POA: Diagnosis not present

## 2017-08-24 ENCOUNTER — Ambulatory Visit (INDEPENDENT_AMBULATORY_CARE_PROVIDER_SITE_OTHER): Payer: Medicare Other | Admitting: Pulmonary Disease

## 2017-08-24 ENCOUNTER — Encounter: Payer: Self-pay | Admitting: Pulmonary Disease

## 2017-08-24 VITALS — BP 142/80 | HR 93 | Ht 75.0 in | Wt 200.0 lb

## 2017-08-24 DIAGNOSIS — J9612 Chronic respiratory failure with hypercapnia: Secondary | ICD-10-CM | POA: Diagnosis not present

## 2017-08-24 DIAGNOSIS — J9611 Chronic respiratory failure with hypoxia: Secondary | ICD-10-CM

## 2017-08-24 DIAGNOSIS — G4733 Obstructive sleep apnea (adult) (pediatric): Secondary | ICD-10-CM

## 2017-08-24 DIAGNOSIS — J449 Chronic obstructive pulmonary disease, unspecified: Secondary | ICD-10-CM

## 2017-08-24 DIAGNOSIS — J432 Centrilobular emphysema: Secondary | ICD-10-CM

## 2017-08-24 NOTE — Progress Notes (Signed)
[facility], Critical [facility], and Sleep Medicine  Chief Complaint  Patient presents with  . Follow-up    OSA with Bipap     Vital signs: BP (!) 142/80 (BP Location: Left Arm, Cuff Size: Normal)   Pulse 93   Ht 6\' 3"  (1.905 m)   Wt 200 lb (90.7 kg)   SpO2 97%   BMI 25.00 kg/m   History of Present Illness: [name] [name] is a 77 y.o. male former smoker with COPD/emphysema, OSA, and chronic respiratory failure with hypoxia.  [place].  He was tx with prednisone and antibiotics.  Improved.  Uses trilogy nightly.  Sleeps well.  Not having cough, wheeze, chest pain, sputum, fever, hemoptysis, or leg swelling.  Gets episodes of confusion.  This happens when he is doing activity, but not using oxygen.  He sometimes takes his oxygen off when he is active if he isn't feeling short of breath.  He does get tired more quickly if he isn't using oxygen.  Physical Exam:  General - pleasant Eyes - pupils reactive ENT - no sinus tenderness, no oral exudate, no LAN Cardiac - regular, no murmur Chest - no wheeze, rales Abd - soft, non tender Ext - no edema Skin - no rashes Neuro - normal strength Psych - normal mood  Assessment/Plan:  COPD with Emphysema. - continue stioloto and prn albuterol  Obstructive sleep apnea. - he is compliant with therapy and reports benefit from therapy - continue Trilogy home vent  Chronic respiratory failure with hypoxia and hypercapnia. - had extensive discussion about role of supplemental oxygen and when to use it - continue 2 liters with activity and 3 liters at night with trilogy   Patient Instructions  Please make sure you are wearing your oxygen when you are doing activities  Follow up in 6 months    Coralyn HellingVineet Paco Cislo, MD [facility] 08/24/2017, 11:09 AM Pager:  [phone]  Flow Sheet  Pulmonary tests: PFT 07/08/15 >> FEV1 0.67 (18%), FEV1% 34, TLC 9.82 (126%), DLCO 32%, no  BD A1AT 05/06/16 >> 125, MM CT chest 12/14/16 >> centrilobular emphysema  Sleep tests: Trilogy 01/07/17 to 02/04/17 >> used on 29 of 29 nights with average 9 hrs 23 min.  Average AHI 7 with IPAP range 13 to 21 cm H2O, EPAP range 5 to 7 cm H2O  Past Medical History: He  has a past medical history of Arthritis, COPD (chronic obstructive pulmonary disease) (HCC), Depression, Impingement syndrome of right shoulder (07/06/2013), On supplemental oxygen therapy, Pneumonia, PTSD (post-traumatic stress disorder), Shortness of breath, and Shoulder arthritis, Right  (07/06/2013).  Past Surgical History: He  has a past surgical history that includes Other surgical history; Appendectomy; Other surgical history; Other surgical history; Other surgical history; Hemorrhoid surgery; Other surgical history; Pilonidal cyst / sinus excision; Other surgical history; Other surgical history; Carpal tunnel release; Cervical fusion; Subdural hematoma evacuation via craniotomy; Total knee arthroplasty (10/19/2011); Joint replacement (2013); ORIF clavicular fracture (02/16/2012); Harvest bone graft (02/16/2012); Hardware Removal (02/16/2012); Shoulder surgery (Right); Colonoscopy; Nose surgery; Knee arthroscopy (Bilateral); Foot surgery (Left); Hemorrhoid surgery; ORIF clavicular fracture (Right, 12/30/2012); Eye surgery; Shoulder arthroscopy with subacromial decompression (Right, 07/04/2013); and Hardware Removal (Right, 07/04/2013).  Family History: His family history includes Breast cancer in his mother; Esophageal cancer in his mother; Prostate cancer in his father.  Social History: He  reports that he quit smoking about 30 years ago. His smoking use included cigarettes. He has a 56.00  pack-year smoking history. He has quit using smokeless tobacco. He reports that he does not drink alcohol or use drugs.  Medications: Allergies as of 08/24/2017   No Known Allergies     Medication List        Accurate as of 08/24/17 11:09 AM. Always  use your most recent med list.          albuterol 108 (90 Base) MCG/ACT inhaler Commonly known as:  PROVENTIL HFA;VENTOLIN HFA Inhale 2 puffs into the lungs every 6 (six) hours as needed for wheezing or shortness of breath.   diclofenac 75 MG EC tablet Commonly known as:  VOLTAREN Take 75 mg by mouth 2 (two) times daily.   PARoxetine 20 MG tablet Commonly known as:  PAXIL Take 20 mg by mouth daily with breakfast.   STIOLTO RESPIMAT 2.5-2.5 MCG/ACT Aers Generic drug:  Tiotropium Bromide-Olodaterol INHALE 2 PUFFS INTO LUNGS DAILY   UNABLE TO FIND BIPAP with 2lpm o2  O2 during the day PRN [employer]   UNABLE TO FIND Med Name: CBD   UNABLE TO FIND Med Name: lung clear

## 2017-08-24 NOTE — Patient Instructions (Signed)
Please make sure you are wearing your oxygen when you are doing activities  Follow up in 6 months

## 2017-10-05 ENCOUNTER — Telehealth: Payer: Self-pay | Admitting: Pulmonary Disease

## 2017-10-05 NOTE — Telephone Encounter (Signed)
Called patient, unable to reach left message to give us a call back. 

## 2017-10-05 NOTE — Telephone Encounter (Signed)
ATC pt, no answer. Left message for pt to call back.  

## 2017-10-05 NOTE — Telephone Encounter (Signed)
Pt returning call. Cb is [phone].

## 2017-10-06 NOTE — Telephone Encounter (Signed)
Pt is returning call. Cb is [phone]

## 2017-10-06 NOTE — Telephone Encounter (Signed)
Spoke with pt. He wanted to let us know exactly which albuterol inhaler he had. This has been updated in his med list. Nothing further was needed.

## 2017-10-06 NOTE — Telephone Encounter (Signed)
Attempted to call the pt. I did not receive an answer. I have left a message for pt to return our call.  

## 2017-10-21 IMAGING — DX DG CHEST 2V
3 series · 3 of 3 positions shown · non-contrast
Comparison: 06/11/2014

CLINICAL DATA: Worsening shortness of breath. Left side chest pain
since last [REDACTED]. Former smoker.

EXAM:
CHEST  2 VIEW

[chest lat]
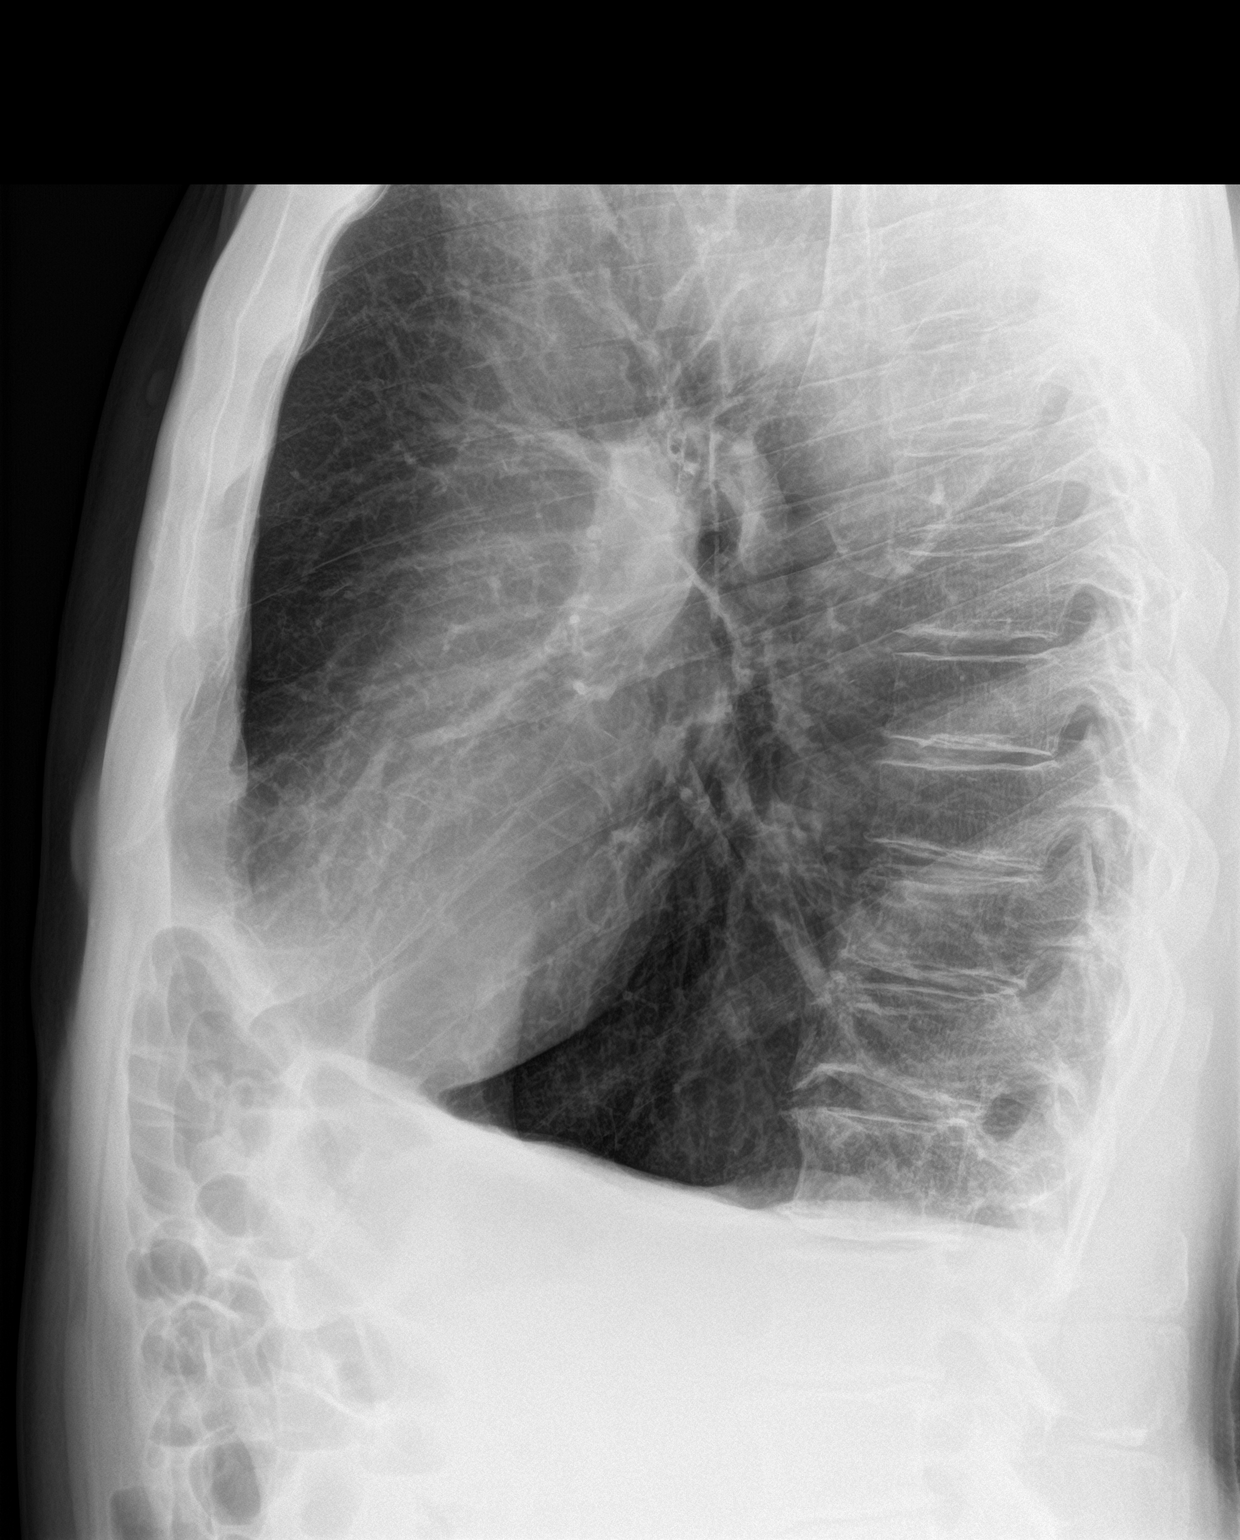

[chest pa (1 of 2)]
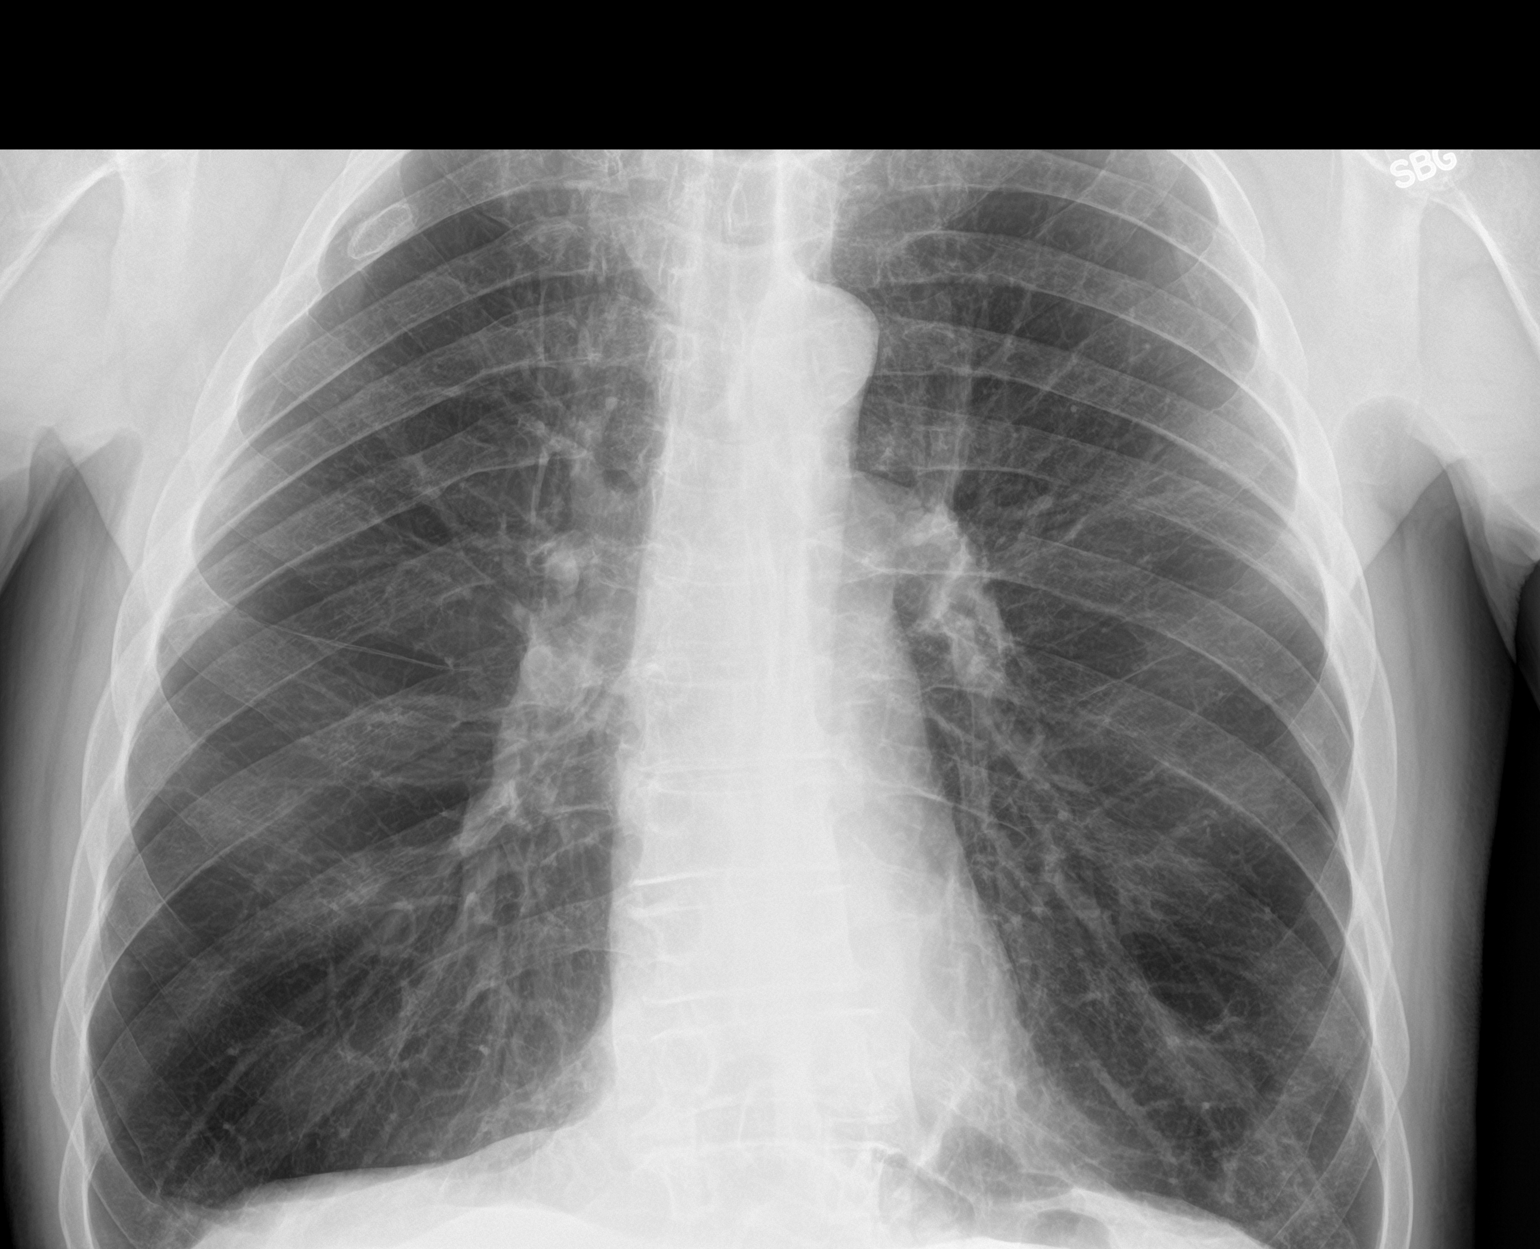

[chest pa (2 of 2)]
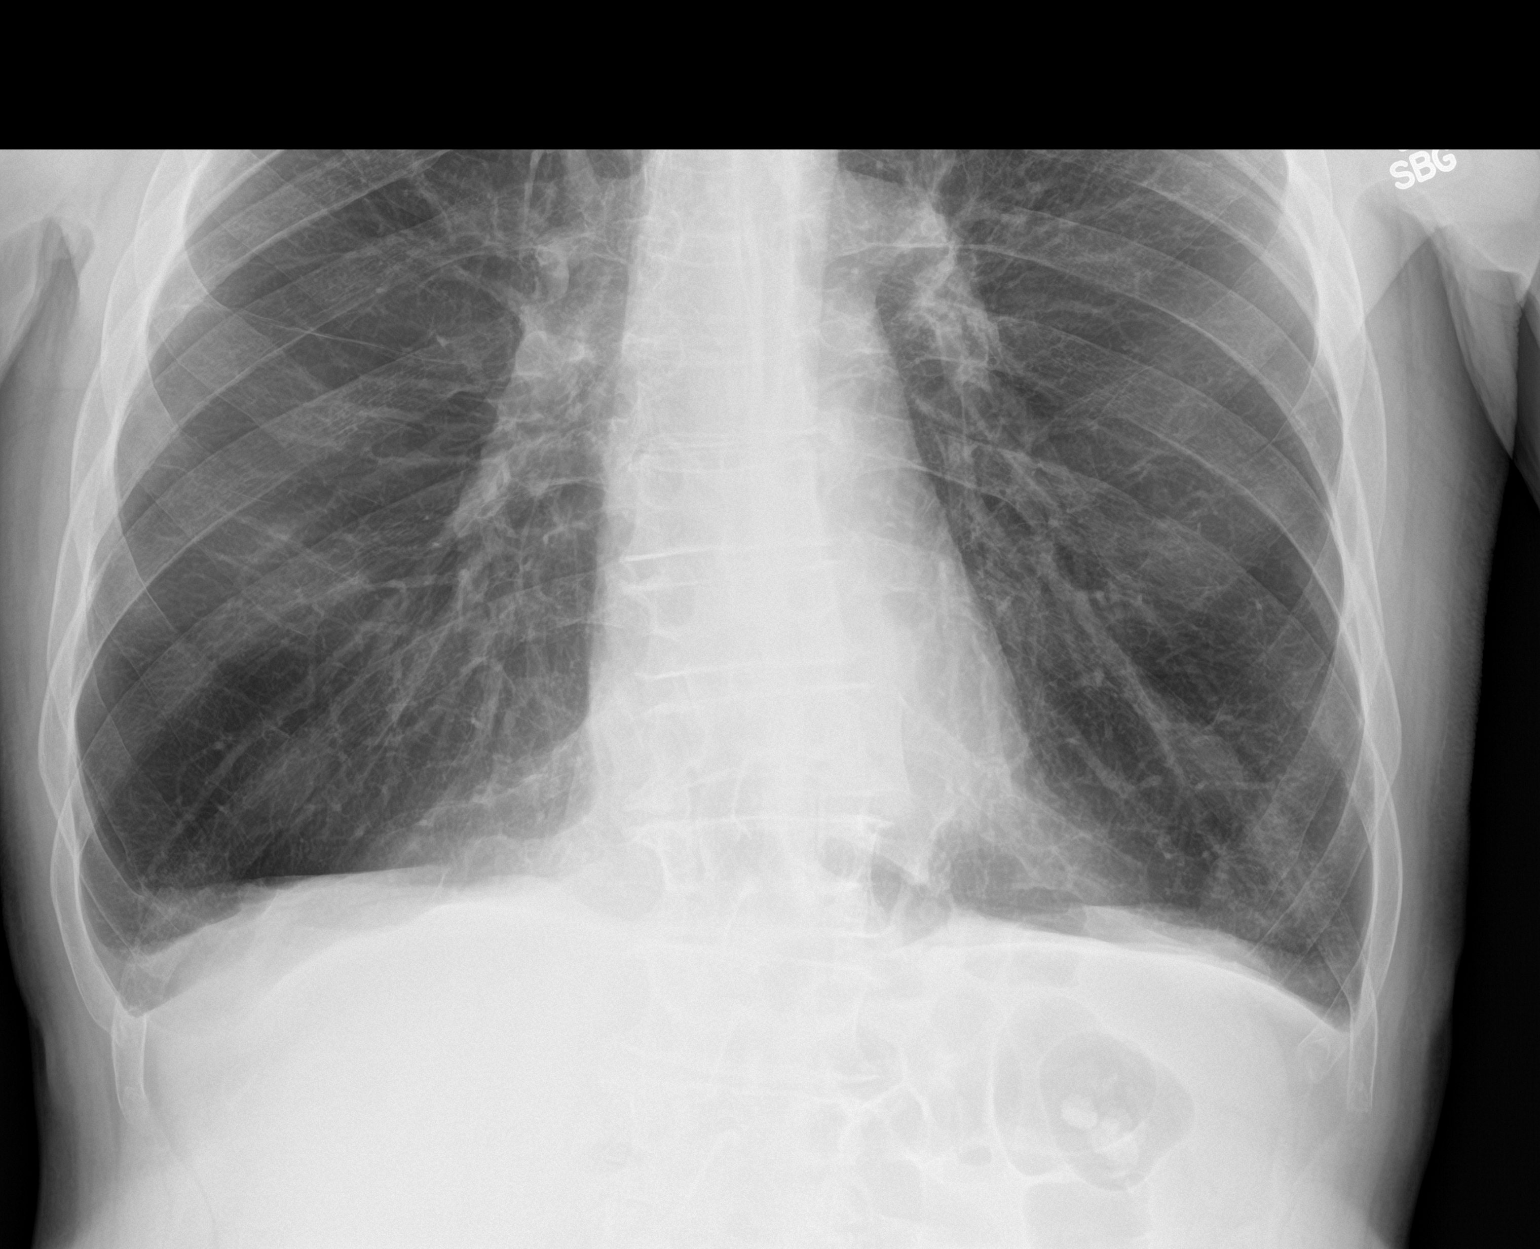

[3 of 3 positions shown; findings below may reference images not displayed]

FINDINGS: The lungs are hyperinflated but clear. Coarsened interstitial
markings are noted. There is no pleural effusion or edema. No
airspace consolidation. The heart size appears normal.
IMPRESSION: 1. No acute cardiopulmonary abnormalities.
2. Emphysema.

## 2017-10-26 DIAGNOSIS — J069 Acute upper respiratory infection, unspecified: Secondary | ICD-10-CM | POA: Diagnosis not present

## 2017-10-26 DIAGNOSIS — J449 Chronic obstructive pulmonary disease, unspecified: Secondary | ICD-10-CM | POA: Diagnosis not present

## 2017-10-26 DIAGNOSIS — J329 Chronic sinusitis, unspecified: Secondary | ICD-10-CM | POA: Diagnosis not present

## 2017-10-26 DIAGNOSIS — Z6827 Body mass index (BMI) 27.0-27.9, adult: Secondary | ICD-10-CM | POA: Diagnosis not present

## 2017-10-26 DIAGNOSIS — K029 Dental caries, unspecified: Secondary | ICD-10-CM | POA: Diagnosis not present

## 2017-10-26 DIAGNOSIS — I10 Essential (primary) hypertension: Secondary | ICD-10-CM | POA: Diagnosis not present

## 2017-10-26 DIAGNOSIS — Z299 Encounter for prophylactic measures, unspecified: Secondary | ICD-10-CM | POA: Diagnosis not present

## 2017-10-29 ENCOUNTER — Telehealth: Payer: Self-pay | Admitting: Pulmonary Disease

## 2017-10-29 MED ORDER — ALBUTEROL SULFATE HFA 108 (90 BASE) MCG/ACT IN AERS: 2.0000 | INHALATION_SPRAY | Freq: Four times a day (QID) | RESPIRATORY_TRACT | 3 refills | Status: AC | PRN

## 2017-10-29 NOTE — Telephone Encounter (Signed)
Called and spoke with patient. Clarified what patient needed. Patient stated that his Proair inhaler is running low and that he needs a refill. Verified pharmacy. Refill sent in to preferred pharmacy. Nothing further is needed at this time.

## 2017-11-07 DIAGNOSIS — F329 Major depressive disorder, single episode, unspecified: Secondary | ICD-10-CM | POA: Diagnosis present

## 2017-11-07 DIAGNOSIS — D72828 Other elevated white blood cell count: Secondary | ICD-10-CM | POA: Diagnosis not present

## 2017-11-07 DIAGNOSIS — R918 Other nonspecific abnormal finding of lung field: Secondary | ICD-10-CM | POA: Diagnosis not present

## 2017-11-07 DIAGNOSIS — I10 Essential (primary) hypertension: Secondary | ICD-10-CM | POA: Diagnosis not present

## 2017-11-07 DIAGNOSIS — J441 Chronic obstructive pulmonary disease with (acute) exacerbation: Secondary | ICD-10-CM | POA: Diagnosis not present

## 2017-11-07 DIAGNOSIS — J189 Pneumonia, unspecified organism: Secondary | ICD-10-CM | POA: Diagnosis not present

## 2017-11-07 DIAGNOSIS — R0602 Shortness of breath: Secondary | ICD-10-CM | POA: Diagnosis not present

## 2017-11-07 DIAGNOSIS — J440 Chronic obstructive pulmonary disease with acute lower respiratory infection: Secondary | ICD-10-CM | POA: Diagnosis present

## 2017-11-07 DIAGNOSIS — R05 Cough: Secondary | ICD-10-CM | POA: Diagnosis not present

## 2017-11-07 DIAGNOSIS — M1710 Unilateral primary osteoarthritis, unspecified knee: Secondary | ICD-10-CM | POA: Diagnosis present

## 2017-11-07 DIAGNOSIS — T380X5A Adverse effect of glucocorticoids and synthetic analogues, initial encounter: Secondary | ICD-10-CM | POA: Diagnosis not present

## 2017-11-07 DIAGNOSIS — R000 Tachycardia, unspecified: Secondary | ICD-10-CM | POA: Diagnosis not present

## 2017-11-07 DIAGNOSIS — D72829 Elevated white blood cell count, unspecified: Secondary | ICD-10-CM | POA: Diagnosis not present

## 2017-11-07 DIAGNOSIS — R531 Weakness: Secondary | ICD-10-CM | POA: Diagnosis not present

## 2017-11-07 DIAGNOSIS — G8929 Other chronic pain: Secondary | ICD-10-CM | POA: Diagnosis present

## 2017-11-07 DIAGNOSIS — J209 Acute bronchitis, unspecified: Secondary | ICD-10-CM | POA: Diagnosis not present

## 2017-11-07 DIAGNOSIS — Z87891 Personal history of nicotine dependence: Secondary | ICD-10-CM | POA: Diagnosis not present

## 2017-11-07 DIAGNOSIS — J181 Lobar pneumonia, unspecified organism: Secondary | ICD-10-CM | POA: Diagnosis not present

## 2017-11-07 DIAGNOSIS — J449 Chronic obstructive pulmonary disease, unspecified: Secondary | ICD-10-CM | POA: Diagnosis not present

## 2017-11-16 DIAGNOSIS — M545 Low back pain: Secondary | ICD-10-CM | POA: Diagnosis not present

## 2017-11-16 DIAGNOSIS — J449 Chronic obstructive pulmonary disease, unspecified: Secondary | ICD-10-CM | POA: Diagnosis not present

## 2017-11-16 DIAGNOSIS — I10 Essential (primary) hypertension: Secondary | ICD-10-CM | POA: Diagnosis not present

## 2017-11-16 DIAGNOSIS — Z79899 Other long term (current) drug therapy: Secondary | ICD-10-CM | POA: Diagnosis not present

## 2017-11-16 DIAGNOSIS — Z299 Encounter for prophylactic measures, unspecified: Secondary | ICD-10-CM | POA: Diagnosis not present

## 2017-11-16 DIAGNOSIS — Z6827 Body mass index (BMI) 27.0-27.9, adult: Secondary | ICD-10-CM | POA: Diagnosis not present

## 2017-12-14 DIAGNOSIS — J440 Chronic obstructive pulmonary disease with acute lower respiratory infection: Secondary | ICD-10-CM | POA: Diagnosis not present

## 2017-12-14 DIAGNOSIS — J209 Acute bronchitis, unspecified: Secondary | ICD-10-CM | POA: Diagnosis not present

## 2017-12-14 DIAGNOSIS — J449 Chronic obstructive pulmonary disease, unspecified: Secondary | ICD-10-CM | POA: Diagnosis not present

## 2017-12-14 DIAGNOSIS — Z6825 Body mass index (BMI) 25.0-25.9, adult: Secondary | ICD-10-CM | POA: Diagnosis not present

## 2017-12-14 DIAGNOSIS — Z299 Encounter for prophylactic measures, unspecified: Secondary | ICD-10-CM | POA: Diagnosis not present

## 2017-12-14 DIAGNOSIS — I10 Essential (primary) hypertension: Secondary | ICD-10-CM | POA: Diagnosis not present

## 2018-02-14 ENCOUNTER — Ambulatory Visit: Payer: Medicare Other | Admitting: Pulmonary Disease

## 2018-02-22 DIAGNOSIS — Z23 Encounter for immunization: Secondary | ICD-10-CM | POA: Diagnosis not present

## 2018-03-09 ENCOUNTER — Ambulatory Visit: Payer: Medicare Other | Admitting: Pulmonary Disease

## 2018-03-21 DIAGNOSIS — Z6825 Body mass index (BMI) 25.0-25.9, adult: Secondary | ICD-10-CM | POA: Diagnosis not present

## 2018-03-21 DIAGNOSIS — Z299 Encounter for prophylactic measures, unspecified: Secondary | ICD-10-CM | POA: Diagnosis not present

## 2018-03-21 DIAGNOSIS — Z1331 Encounter for screening for depression: Secondary | ICD-10-CM | POA: Diagnosis not present

## 2018-03-21 DIAGNOSIS — J440 Chronic obstructive pulmonary disease with acute lower respiratory infection: Secondary | ICD-10-CM | POA: Diagnosis not present

## 2018-03-21 DIAGNOSIS — I10 Essential (primary) hypertension: Secondary | ICD-10-CM | POA: Diagnosis not present

## 2018-03-21 DIAGNOSIS — Z1339 Encounter for screening examination for other mental health and behavioral disorders: Secondary | ICD-10-CM | POA: Diagnosis not present

## 2018-03-21 DIAGNOSIS — Z0000 Encounter for general adult medical examination without abnormal findings: Secondary | ICD-10-CM | POA: Diagnosis not present

## 2018-03-21 DIAGNOSIS — Z7189 Other specified counseling: Secondary | ICD-10-CM | POA: Diagnosis not present

## 2018-03-21 DIAGNOSIS — Z1211 Encounter for screening for malignant neoplasm of colon: Secondary | ICD-10-CM | POA: Diagnosis not present

## 2018-03-21 DIAGNOSIS — R5383 Other fatigue: Secondary | ICD-10-CM | POA: Diagnosis not present

## 2018-03-23 DIAGNOSIS — Z79899 Other long term (current) drug therapy: Secondary | ICD-10-CM | POA: Diagnosis not present

## 2018-03-23 DIAGNOSIS — Z125 Encounter for screening for malignant neoplasm of prostate: Secondary | ICD-10-CM | POA: Diagnosis not present

## 2018-03-23 DIAGNOSIS — I10 Essential (primary) hypertension: Secondary | ICD-10-CM | POA: Diagnosis not present

## 2018-03-23 DIAGNOSIS — R5383 Other fatigue: Secondary | ICD-10-CM | POA: Diagnosis not present

## 2018-05-02 DIAGNOSIS — J449 Chronic obstructive pulmonary disease, unspecified: Secondary | ICD-10-CM | POA: Diagnosis not present

## 2018-05-02 DIAGNOSIS — H109 Unspecified conjunctivitis: Secondary | ICD-10-CM | POA: Diagnosis not present

## 2018-05-02 DIAGNOSIS — Z6826 Body mass index (BMI) 26.0-26.9, adult: Secondary | ICD-10-CM | POA: Diagnosis not present

## 2018-05-02 DIAGNOSIS — Z299 Encounter for prophylactic measures, unspecified: Secondary | ICD-10-CM | POA: Diagnosis not present

## 2018-09-12 DIAGNOSIS — I10 Essential (primary) hypertension: Secondary | ICD-10-CM | POA: Diagnosis not present

## 2018-09-12 DIAGNOSIS — J449 Chronic obstructive pulmonary disease, unspecified: Secondary | ICD-10-CM | POA: Diagnosis not present

## 2018-09-12 DIAGNOSIS — M47816 Spondylosis without myelopathy or radiculopathy, lumbar region: Secondary | ICD-10-CM | POA: Diagnosis not present

## 2018-09-12 DIAGNOSIS — Z713 Dietary counseling and surveillance: Secondary | ICD-10-CM | POA: Diagnosis not present

## 2018-09-12 DIAGNOSIS — Z299 Encounter for prophylactic measures, unspecified: Secondary | ICD-10-CM | POA: Diagnosis not present

## 2018-11-03 IMAGING — CT CT CHEST W/O CM
2 of 3 series · 15 of 36 positions shown, 18 images · non-contrast
Comparison: December 04, 2015 chest CT and December 02, 2015 chest radiograph

CLINICAL DATA: Shortness of Breath

EXAM:
CT CHEST WITHOUT CONTRAST
TECHNIQUE: Multidetector CT imaging of the chest was performed following the
standard protocol without IV contrast.

[Series 2: thorax · axial · 0.77mm/px · z∈[+873,+1199]mm · 12 of 193 slices shown, 15 images]
[im 15/193  mediastinal]
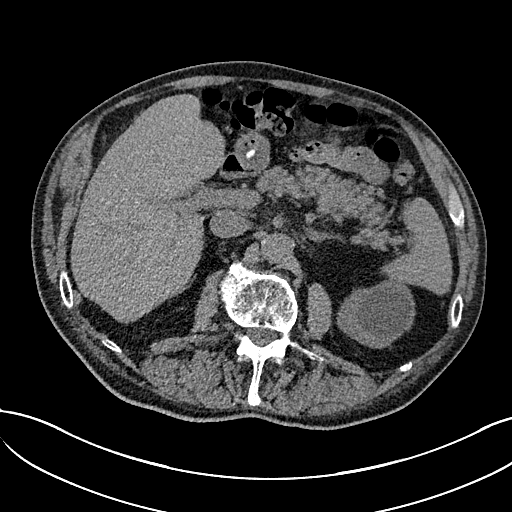
[im 15/193  lung]
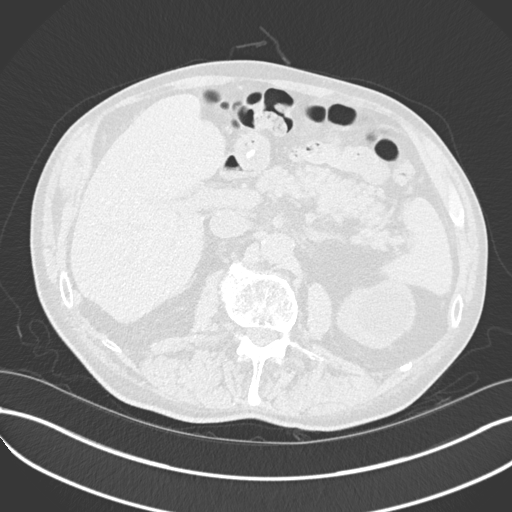
[im 29/193  lung]
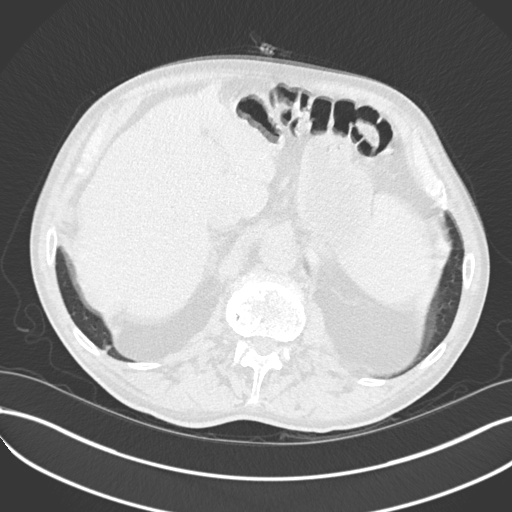
[im 43/193  lung]
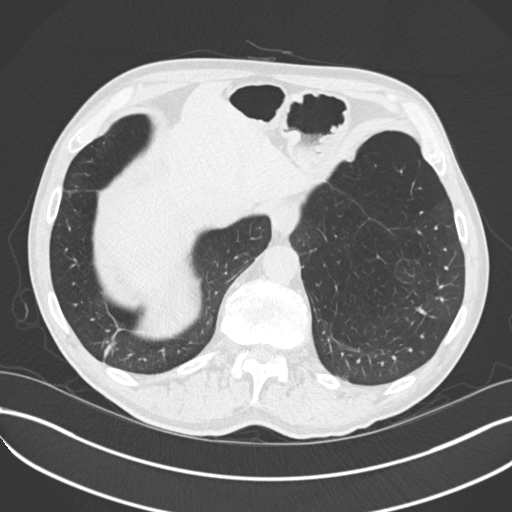
[im 57/193  lung]
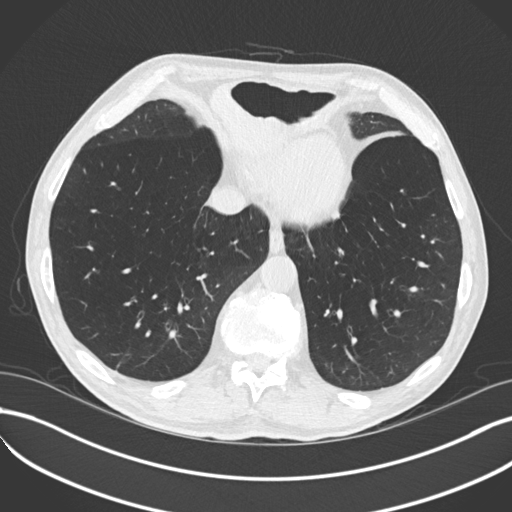
[im 72/193  mediastinal]
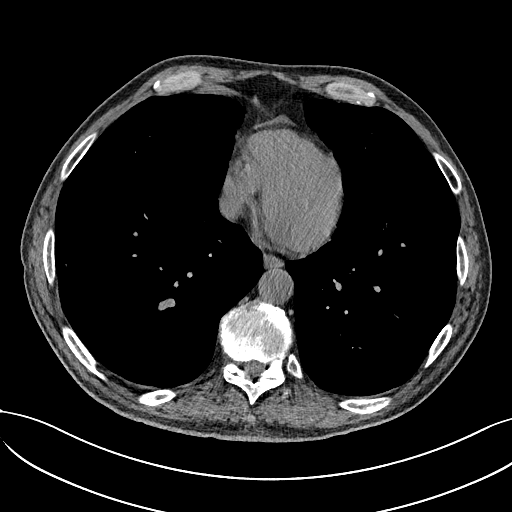
[im 72/193  lung]
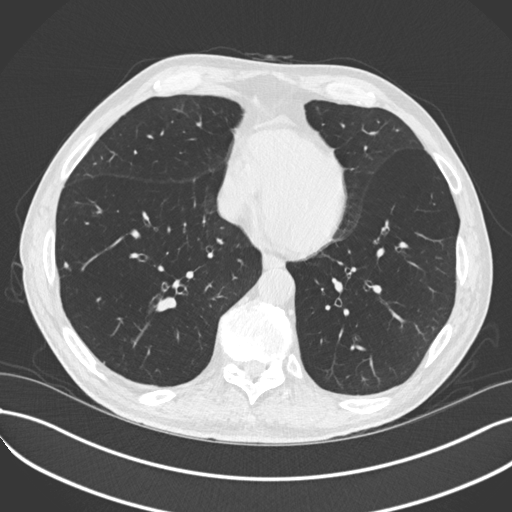
[im 86/193  lung]
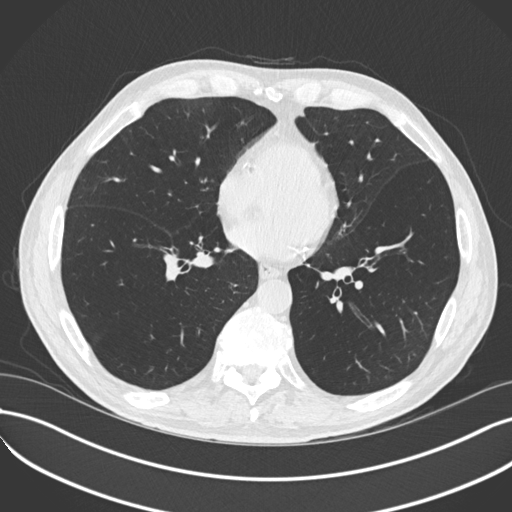
[im 107/193  lung]
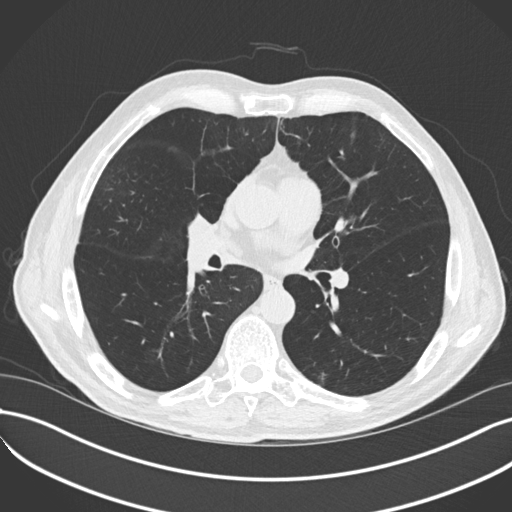
[im 121/193  lung]
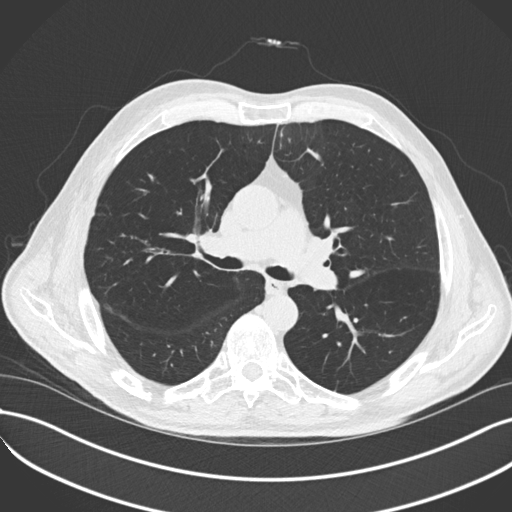
[im 136/193  mediastinal]
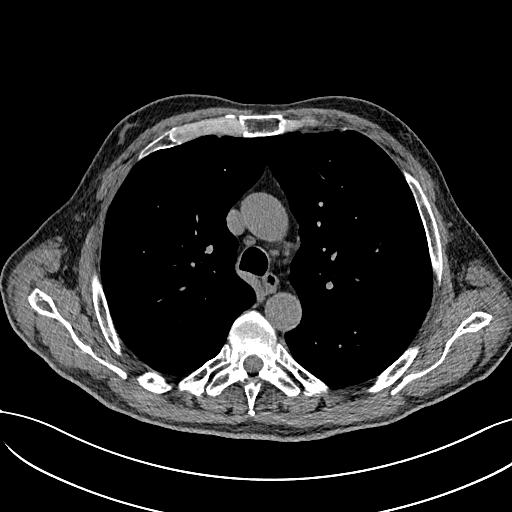
[im 136/193  lung]
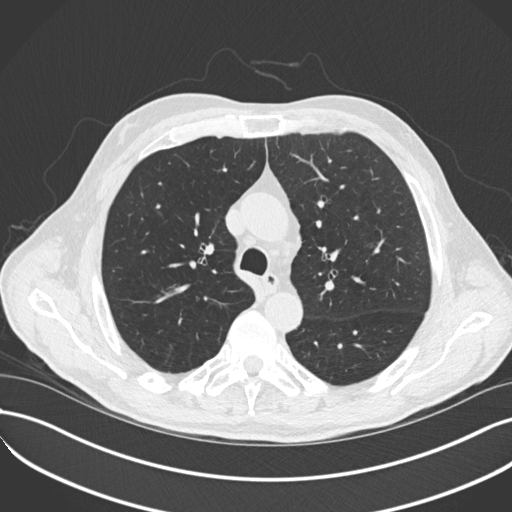
[im 150/193  lung]
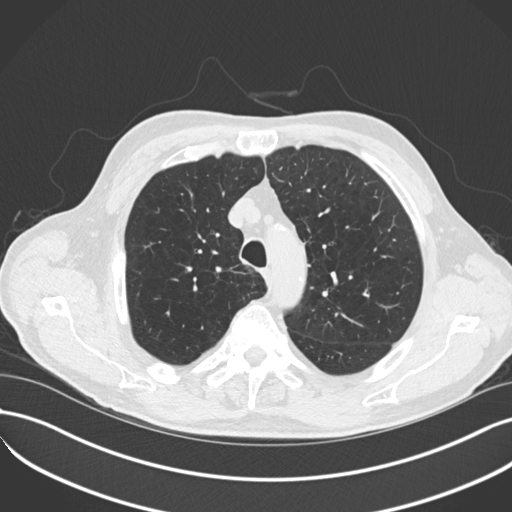
[im 164/193  lung]
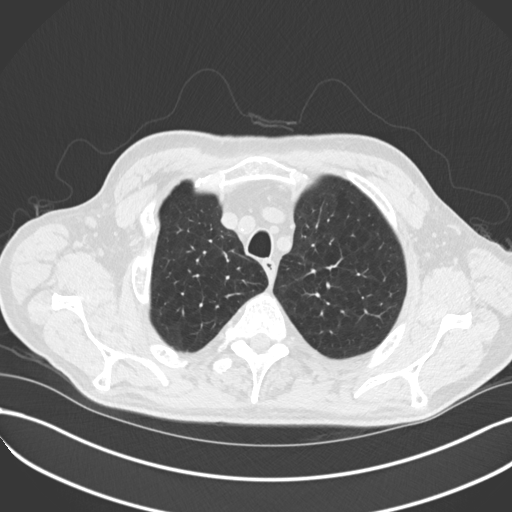
[im 178/193  lung]
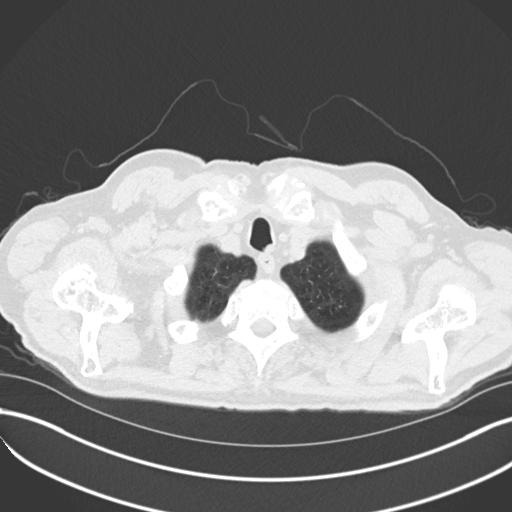

[Series 5: coronal · coronal · 0.77mm/px · 3 of 150 slices shown]
[im 30/150  lung]
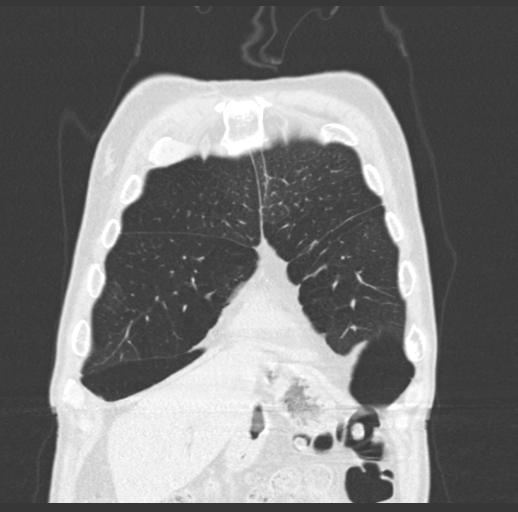
[im 60/150  lung]
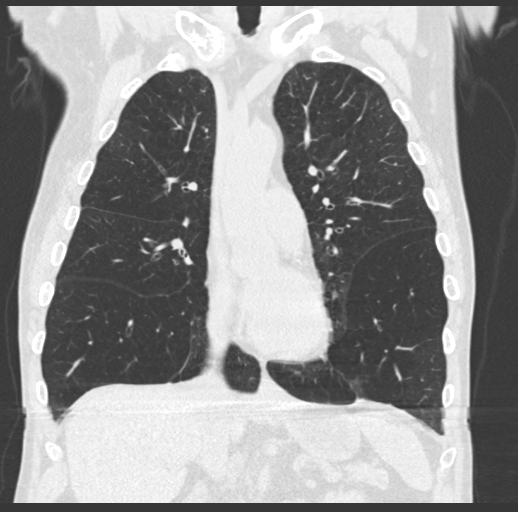
[im 90/150  lung]
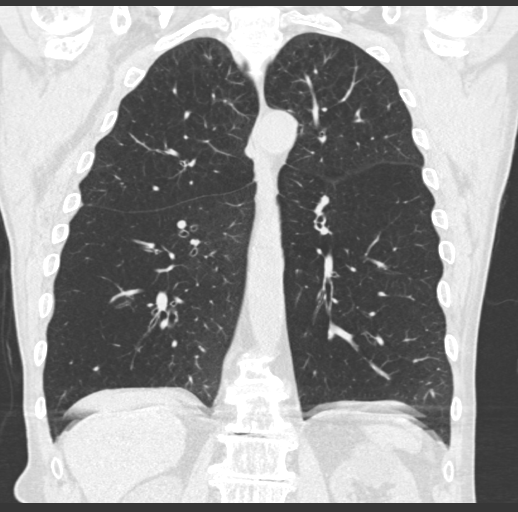

[15 of 36 positions shown; findings below may reference images not displayed]

FINDINGS: Cardiovascular: There is no thoracic aortic aneurysm. There are
scattered foci of atherosclerotic calcification in the proximal
right common carotid artery and left subclavian artery regions.
There is atherosclerotic calcification in the aorta. There are
multiple foci of coronary artery calcification. Pericardium is not
appreciably thickened.

Mediastinum/Nodes: Visualized thyroid appears unremarkable. There
are scattered subcentimeter mediastinal lymph nodes. There is no
adenopathy by size criteria in the chest region.

Lungs/Pleura: There is underlying centrilobular emphysematous
change. There is mild bibasilar scarring with scattered foci of mild
atelectatic change in both lower lobes. There is no appreciable
edema or consolidation. No pleural effusion or pleural thickening
evident.

Upper Abdomen: There is a cyst arising in the upper pole of the left
kidney measuring

Musculoskeletal: Old trauma in the right clavicle with
pseudoarthrosis in the lateral right clavicle remain stable. Prior
fracture of the right third rib is again noted. There is
degenerative change in thoracic spine. Several areas of apparent
endplate infarcts noted, stable. No new bone lesions are
appreciable.
IMPRESSION: 1. Underlying centrilobular emphysema without edema or consolidation
evident. Areas of mild scarring and atelectasis noted.

2. Foci of atherosclerotic calcification in the aorta as well as
foci of coronary artery calcification at several sites noted.

3.  No adenopathy.

4.  Areas of prior bone trauma, stable.

Aortic Atherosclerosis (0MDZE-TLK.K) and Emphysema (0MDZE-[id number]).

## 2019-01-05 DIAGNOSIS — J449 Chronic obstructive pulmonary disease, unspecified: Secondary | ICD-10-CM | POA: Diagnosis not present

## 2019-01-05 DIAGNOSIS — I10 Essential (primary) hypertension: Secondary | ICD-10-CM | POA: Diagnosis not present

## 2019-01-05 DIAGNOSIS — Z299 Encounter for prophylactic measures, unspecified: Secondary | ICD-10-CM | POA: Diagnosis not present

## 2019-01-05 DIAGNOSIS — Z6826 Body mass index (BMI) 26.0-26.9, adult: Secondary | ICD-10-CM | POA: Diagnosis not present

## 2019-01-05 DIAGNOSIS — Z87891 Personal history of nicotine dependence: Secondary | ICD-10-CM | POA: Diagnosis not present

## 2019-01-05 DIAGNOSIS — M47816 Spondylosis without myelopathy or radiculopathy, lumbar region: Secondary | ICD-10-CM | POA: Diagnosis not present

## 2019-01-18 DIAGNOSIS — Z23 Encounter for immunization: Secondary | ICD-10-CM | POA: Diagnosis not present

## 2019-04-12 DIAGNOSIS — M545 Low back pain: Secondary | ICD-10-CM | POA: Diagnosis not present

## 2019-04-12 DIAGNOSIS — I10 Essential (primary) hypertension: Secondary | ICD-10-CM | POA: Diagnosis not present

## 2019-04-12 DIAGNOSIS — Z6826 Body mass index (BMI) 26.0-26.9, adult: Secondary | ICD-10-CM | POA: Diagnosis not present

## 2019-04-12 DIAGNOSIS — J449 Chronic obstructive pulmonary disease, unspecified: Secondary | ICD-10-CM | POA: Diagnosis not present

## 2019-04-12 DIAGNOSIS — M62830 Muscle spasm of back: Secondary | ICD-10-CM | POA: Diagnosis not present

## 2019-04-12 DIAGNOSIS — Z299 Encounter for prophylactic measures, unspecified: Secondary | ICD-10-CM | POA: Diagnosis not present

## 2019-05-30 DIAGNOSIS — R05 Cough: Secondary | ICD-10-CM | POA: Diagnosis not present

## 2019-06-13 DIAGNOSIS — Z23 Encounter for immunization: Secondary | ICD-10-CM | POA: Diagnosis not present

## 2019-06-23 DIAGNOSIS — Z6826 Body mass index (BMI) 26.0-26.9, adult: Secondary | ICD-10-CM | POA: Diagnosis not present

## 2019-06-23 DIAGNOSIS — I10 Essential (primary) hypertension: Secondary | ICD-10-CM | POA: Diagnosis not present

## 2019-06-23 DIAGNOSIS — M47816 Spondylosis without myelopathy or radiculopathy, lumbar region: Secondary | ICD-10-CM | POA: Diagnosis not present

## 2019-06-23 DIAGNOSIS — J449 Chronic obstructive pulmonary disease, unspecified: Secondary | ICD-10-CM | POA: Diagnosis not present

## 2019-06-23 DIAGNOSIS — Z87891 Personal history of nicotine dependence: Secondary | ICD-10-CM | POA: Diagnosis not present

## 2019-06-23 DIAGNOSIS — Z299 Encounter for prophylactic measures, unspecified: Secondary | ICD-10-CM | POA: Diagnosis not present

## 2019-07-24 DIAGNOSIS — Z23 Encounter for immunization: Secondary | ICD-10-CM | POA: Diagnosis not present

## 2019-07-27 DIAGNOSIS — Z6825 Body mass index (BMI) 25.0-25.9, adult: Secondary | ICD-10-CM | POA: Diagnosis not present

## 2019-07-27 DIAGNOSIS — J9611 Chronic respiratory failure with hypoxia: Secondary | ICD-10-CM | POA: Diagnosis not present

## 2019-07-27 DIAGNOSIS — R0602 Shortness of breath: Secondary | ICD-10-CM | POA: Diagnosis not present

## 2019-07-27 DIAGNOSIS — Z299 Encounter for prophylactic measures, unspecified: Secondary | ICD-10-CM | POA: Diagnosis not present

## 2019-07-27 DIAGNOSIS — M546 Pain in thoracic spine: Secondary | ICD-10-CM | POA: Diagnosis not present

## 2019-07-27 DIAGNOSIS — I10 Essential (primary) hypertension: Secondary | ICD-10-CM | POA: Diagnosis not present

## 2019-07-27 DIAGNOSIS — J449 Chronic obstructive pulmonary disease, unspecified: Secondary | ICD-10-CM | POA: Diagnosis not present

## 2019-08-02 DIAGNOSIS — R0602 Shortness of breath: Secondary | ICD-10-CM | POA: Diagnosis not present

## 2019-08-02 DIAGNOSIS — S22050A Wedge compression fracture of T5-T6 vertebra, initial encounter for closed fracture: Secondary | ICD-10-CM | POA: Diagnosis not present

## 2019-08-02 DIAGNOSIS — N281 Cyst of kidney, acquired: Secondary | ICD-10-CM | POA: Diagnosis not present

## 2019-08-02 DIAGNOSIS — M546 Pain in thoracic spine: Secondary | ICD-10-CM | POA: Diagnosis not present

## 2019-08-02 DIAGNOSIS — N200 Calculus of kidney: Secondary | ICD-10-CM | POA: Diagnosis not present

## 2019-08-02 DIAGNOSIS — J984 Other disorders of lung: Secondary | ICD-10-CM | POA: Diagnosis not present

## 2019-08-02 DIAGNOSIS — S2242XA Multiple fractures of ribs, left side, initial encounter for closed fracture: Secondary | ICD-10-CM | POA: Diagnosis not present

## 2019-08-02 DIAGNOSIS — J439 Emphysema, unspecified: Secondary | ICD-10-CM | POA: Diagnosis not present

## 2019-08-02 DIAGNOSIS — I700 Atherosclerosis of aorta: Secondary | ICD-10-CM | POA: Diagnosis not present

## 2019-08-09 DIAGNOSIS — Z6825 Body mass index (BMI) 25.0-25.9, adult: Secondary | ICD-10-CM | POA: Diagnosis not present

## 2019-08-09 DIAGNOSIS — Z299 Encounter for prophylactic measures, unspecified: Secondary | ICD-10-CM | POA: Diagnosis not present

## 2019-08-09 DIAGNOSIS — D692 Other nonthrombocytopenic purpura: Secondary | ICD-10-CM | POA: Diagnosis not present

## 2019-08-09 DIAGNOSIS — J449 Chronic obstructive pulmonary disease, unspecified: Secondary | ICD-10-CM | POA: Diagnosis not present

## 2019-08-09 DIAGNOSIS — I10 Essential (primary) hypertension: Secondary | ICD-10-CM | POA: Diagnosis not present

## 2019-08-09 DIAGNOSIS — S22050A Wedge compression fracture of T5-T6 vertebra, initial encounter for closed fracture: Secondary | ICD-10-CM | POA: Diagnosis not present

## 2019-08-09 DIAGNOSIS — I700 Atherosclerosis of aorta: Secondary | ICD-10-CM | POA: Diagnosis not present

## 2019-08-18 DIAGNOSIS — M8468XA Pathological fracture in other disease, other site, initial encounter for fracture: Secondary | ICD-10-CM | POA: Diagnosis not present

## 2019-09-18 DIAGNOSIS — H2513 Age-related nuclear cataract, bilateral: Secondary | ICD-10-CM | POA: Diagnosis not present

## 2019-09-18 DIAGNOSIS — H40033 Anatomical narrow angle, bilateral: Secondary | ICD-10-CM | POA: Diagnosis not present

## 2019-10-05 DIAGNOSIS — Z01818 Encounter for other preprocedural examination: Secondary | ICD-10-CM | POA: Diagnosis not present

## 2019-10-05 DIAGNOSIS — H25812 Combined forms of age-related cataract, left eye: Secondary | ICD-10-CM | POA: Diagnosis not present

## 2019-10-05 DIAGNOSIS — H04123 Dry eye syndrome of bilateral lacrimal glands: Secondary | ICD-10-CM | POA: Diagnosis not present

## 2019-10-05 DIAGNOSIS — H25811 Combined forms of age-related cataract, right eye: Secondary | ICD-10-CM | POA: Diagnosis not present

## 2019-10-05 DIAGNOSIS — H2511 Age-related nuclear cataract, right eye: Secondary | ICD-10-CM | POA: Diagnosis not present

## 2019-10-12 DIAGNOSIS — I700 Atherosclerosis of aorta: Secondary | ICD-10-CM | POA: Diagnosis not present

## 2019-10-12 DIAGNOSIS — I10 Essential (primary) hypertension: Secondary | ICD-10-CM | POA: Diagnosis not present

## 2019-10-12 DIAGNOSIS — R079 Chest pain, unspecified: Secondary | ICD-10-CM | POA: Diagnosis not present

## 2019-10-12 DIAGNOSIS — J449 Chronic obstructive pulmonary disease, unspecified: Secondary | ICD-10-CM | POA: Diagnosis not present

## 2019-10-12 DIAGNOSIS — Z299 Encounter for prophylactic measures, unspecified: Secondary | ICD-10-CM | POA: Diagnosis not present

## 2019-10-26 DIAGNOSIS — H2512 Age-related nuclear cataract, left eye: Secondary | ICD-10-CM | POA: Diagnosis not present

## 2019-10-26 DIAGNOSIS — H25812 Combined forms of age-related cataract, left eye: Secondary | ICD-10-CM | POA: Diagnosis not present

## 2019-11-29 DIAGNOSIS — M4312 Spondylolisthesis, cervical region: Secondary | ICD-10-CM | POA: Diagnosis not present

## 2019-11-29 DIAGNOSIS — M47812 Spondylosis without myelopathy or radiculopathy, cervical region: Secondary | ICD-10-CM | POA: Diagnosis not present

## 2019-11-29 DIAGNOSIS — R531 Weakness: Secondary | ICD-10-CM | POA: Diagnosis not present

## 2019-12-14 DIAGNOSIS — M4712 Other spondylosis with myelopathy, cervical region: Secondary | ICD-10-CM | POA: Diagnosis not present

## 2019-12-14 DIAGNOSIS — G500 Trigeminal neuralgia: Secondary | ICD-10-CM | POA: Diagnosis not present

## 2019-12-26 DIAGNOSIS — M47812 Spondylosis without myelopathy or radiculopathy, cervical region: Secondary | ICD-10-CM | POA: Diagnosis not present

## 2019-12-26 DIAGNOSIS — M4802 Spinal stenosis, cervical region: Secondary | ICD-10-CM | POA: Diagnosis not present

## 2019-12-26 DIAGNOSIS — M4712 Other spondylosis with myelopathy, cervical region: Secondary | ICD-10-CM | POA: Diagnosis not present

## 2019-12-26 DIAGNOSIS — G500 Trigeminal neuralgia: Secondary | ICD-10-CM | POA: Diagnosis not present

## 2020-01-16 DIAGNOSIS — M542 Cervicalgia: Secondary | ICD-10-CM | POA: Diagnosis not present

## 2020-02-08 DIAGNOSIS — M47812 Spondylosis without myelopathy or radiculopathy, cervical region: Secondary | ICD-10-CM | POA: Diagnosis not present

## 2020-02-08 DIAGNOSIS — M542 Cervicalgia: Secondary | ICD-10-CM | POA: Diagnosis not present

## 2020-02-14 DIAGNOSIS — I700 Atherosclerosis of aorta: Secondary | ICD-10-CM | POA: Diagnosis not present

## 2020-02-14 DIAGNOSIS — J449 Chronic obstructive pulmonary disease, unspecified: Secondary | ICD-10-CM | POA: Diagnosis not present

## 2020-02-14 DIAGNOSIS — F322 Major depressive disorder, single episode, severe without psychotic features: Secondary | ICD-10-CM | POA: Diagnosis not present

## 2020-02-14 DIAGNOSIS — H6122 Impacted cerumen, left ear: Secondary | ICD-10-CM | POA: Diagnosis not present

## 2020-02-14 DIAGNOSIS — Z299 Encounter for prophylactic measures, unspecified: Secondary | ICD-10-CM | POA: Diagnosis not present

## 2020-02-19 DIAGNOSIS — Z299 Encounter for prophylactic measures, unspecified: Secondary | ICD-10-CM | POA: Diagnosis not present

## 2020-02-19 DIAGNOSIS — I10 Essential (primary) hypertension: Secondary | ICD-10-CM | POA: Diagnosis not present

## 2020-02-19 DIAGNOSIS — H6122 Impacted cerumen, left ear: Secondary | ICD-10-CM | POA: Diagnosis not present

## 2020-03-11 DIAGNOSIS — M47812 Spondylosis without myelopathy or radiculopathy, cervical region: Secondary | ICD-10-CM | POA: Diagnosis not present

## 2020-03-12 DIAGNOSIS — I10 Essential (primary) hypertension: Secondary | ICD-10-CM | POA: Diagnosis not present

## 2020-03-12 DIAGNOSIS — S22080A Wedge compression fracture of T11-T12 vertebra, initial encounter for closed fracture: Secondary | ICD-10-CM | POA: Diagnosis not present

## 2020-03-12 DIAGNOSIS — J9611 Chronic respiratory failure with hypoxia: Secondary | ICD-10-CM | POA: Diagnosis not present

## 2020-03-12 DIAGNOSIS — K122 Cellulitis and abscess of mouth: Secondary | ICD-10-CM | POA: Diagnosis not present

## 2020-03-12 DIAGNOSIS — Z299 Encounter for prophylactic measures, unspecified: Secondary | ICD-10-CM | POA: Diagnosis not present

## 2020-04-16 DIAGNOSIS — Z23 Encounter for immunization: Secondary | ICD-10-CM | POA: Diagnosis not present

## 2020-05-09 DIAGNOSIS — M47812 Spondylosis without myelopathy or radiculopathy, cervical region: Secondary | ICD-10-CM | POA: Diagnosis not present

## 2020-05-16 DIAGNOSIS — Z23 Encounter for immunization: Secondary | ICD-10-CM | POA: Diagnosis not present

## 2020-06-24 DIAGNOSIS — M546 Pain in thoracic spine: Secondary | ICD-10-CM | POA: Diagnosis not present

## 2020-06-24 DIAGNOSIS — J449 Chronic obstructive pulmonary disease, unspecified: Secondary | ICD-10-CM | POA: Diagnosis not present

## 2020-06-24 DIAGNOSIS — I700 Atherosclerosis of aorta: Secondary | ICD-10-CM | POA: Diagnosis not present

## 2020-06-24 DIAGNOSIS — Z299 Encounter for prophylactic measures, unspecified: Secondary | ICD-10-CM | POA: Diagnosis not present

## 2020-06-24 DIAGNOSIS — I10 Essential (primary) hypertension: Secondary | ICD-10-CM | POA: Diagnosis not present

## 2020-07-12 DIAGNOSIS — G500 Trigeminal neuralgia: Secondary | ICD-10-CM | POA: Diagnosis not present

## 2020-07-12 DIAGNOSIS — M47812 Spondylosis without myelopathy or radiculopathy, cervical region: Secondary | ICD-10-CM | POA: Diagnosis not present

## 2020-08-08 DIAGNOSIS — M47812 Spondylosis without myelopathy or radiculopathy, cervical region: Secondary | ICD-10-CM | POA: Diagnosis not present

## 2020-08-29 DIAGNOSIS — M542 Cervicalgia: Secondary | ICD-10-CM | POA: Diagnosis not present

## 2020-08-29 DIAGNOSIS — I10 Essential (primary) hypertension: Secondary | ICD-10-CM | POA: Diagnosis not present

## 2020-08-29 DIAGNOSIS — G8929 Other chronic pain: Secondary | ICD-10-CM | POA: Diagnosis not present

## 2020-08-29 DIAGNOSIS — J440 Chronic obstructive pulmonary disease with acute lower respiratory infection: Secondary | ICD-10-CM | POA: Diagnosis not present

## 2020-08-29 DIAGNOSIS — Z299 Encounter for prophylactic measures, unspecified: Secondary | ICD-10-CM | POA: Diagnosis not present

## 2020-08-29 DIAGNOSIS — J209 Acute bronchitis, unspecified: Secondary | ICD-10-CM | POA: Diagnosis not present

## 2020-09-10 DIAGNOSIS — M47812 Spondylosis without myelopathy or radiculopathy, cervical region: Secondary | ICD-10-CM | POA: Diagnosis not present

## 2020-10-09 DIAGNOSIS — Z6824 Body mass index (BMI) 24.0-24.9, adult: Secondary | ICD-10-CM | POA: Diagnosis not present

## 2020-10-09 DIAGNOSIS — Z125 Encounter for screening for malignant neoplasm of prostate: Secondary | ICD-10-CM | POA: Diagnosis not present

## 2020-10-09 DIAGNOSIS — Z87891 Personal history of nicotine dependence: Secondary | ICD-10-CM | POA: Diagnosis not present

## 2020-10-09 DIAGNOSIS — Z1331 Encounter for screening for depression: Secondary | ICD-10-CM | POA: Diagnosis not present

## 2020-10-09 DIAGNOSIS — I10 Essential (primary) hypertension: Secondary | ICD-10-CM | POA: Diagnosis not present

## 2020-10-09 DIAGNOSIS — Z79899 Other long term (current) drug therapy: Secondary | ICD-10-CM | POA: Diagnosis not present

## 2020-10-09 DIAGNOSIS — R5383 Other fatigue: Secondary | ICD-10-CM | POA: Diagnosis not present

## 2020-10-09 DIAGNOSIS — E7800 Pure hypercholesterolemia, unspecified: Secondary | ICD-10-CM | POA: Diagnosis not present

## 2020-10-09 DIAGNOSIS — Z299 Encounter for prophylactic measures, unspecified: Secondary | ICD-10-CM | POA: Diagnosis not present

## 2020-10-09 DIAGNOSIS — Z0000 Encounter for general adult medical examination without abnormal findings: Secondary | ICD-10-CM | POA: Diagnosis not present

## 2020-10-09 DIAGNOSIS — Z1339 Encounter for screening examination for other mental health and behavioral disorders: Secondary | ICD-10-CM | POA: Diagnosis not present

## 2020-10-09 DIAGNOSIS — Z7189 Other specified counseling: Secondary | ICD-10-CM | POA: Diagnosis not present

## 2020-11-01 DIAGNOSIS — Z299 Encounter for prophylactic measures, unspecified: Secondary | ICD-10-CM | POA: Diagnosis not present

## 2020-11-01 DIAGNOSIS — I10 Essential (primary) hypertension: Secondary | ICD-10-CM | POA: Diagnosis not present

## 2020-11-01 DIAGNOSIS — R972 Elevated prostate specific antigen [PSA]: Secondary | ICD-10-CM | POA: Diagnosis not present

## 2020-11-11 DIAGNOSIS — M47812 Spondylosis without myelopathy or radiculopathy, cervical region: Secondary | ICD-10-CM | POA: Diagnosis not present

## 2020-12-16 DIAGNOSIS — G500 Trigeminal neuralgia: Secondary | ICD-10-CM | POA: Diagnosis not present

## 2020-12-16 DIAGNOSIS — M47812 Spondylosis without myelopathy or radiculopathy, cervical region: Secondary | ICD-10-CM | POA: Diagnosis not present

## 2020-12-16 DIAGNOSIS — M5481 Occipital neuralgia: Secondary | ICD-10-CM | POA: Diagnosis not present

## 2020-12-23 DIAGNOSIS — J449 Chronic obstructive pulmonary disease, unspecified: Secondary | ICD-10-CM | POA: Diagnosis not present

## 2020-12-23 DIAGNOSIS — I700 Atherosclerosis of aorta: Secondary | ICD-10-CM | POA: Diagnosis not present

## 2020-12-23 DIAGNOSIS — J441 Chronic obstructive pulmonary disease with (acute) exacerbation: Secondary | ICD-10-CM | POA: Diagnosis not present

## 2020-12-23 DIAGNOSIS — Z299 Encounter for prophylactic measures, unspecified: Secondary | ICD-10-CM | POA: Diagnosis not present

## 2020-12-23 DIAGNOSIS — I10 Essential (primary) hypertension: Secondary | ICD-10-CM | POA: Diagnosis not present

## 2021-01-07 DIAGNOSIS — M5481 Occipital neuralgia: Secondary | ICD-10-CM | POA: Diagnosis not present

## 2021-02-24 DIAGNOSIS — Z23 Encounter for immunization: Secondary | ICD-10-CM | POA: Diagnosis not present

## 2021-02-24 DIAGNOSIS — Z20828 Contact with and (suspected) exposure to other viral communicable diseases: Secondary | ICD-10-CM | POA: Diagnosis not present

## 2021-03-18 DIAGNOSIS — R0602 Shortness of breath: Secondary | ICD-10-CM | POA: Diagnosis not present

## 2021-03-18 DIAGNOSIS — J439 Emphysema, unspecified: Secondary | ICD-10-CM | POA: Diagnosis not present

## 2021-03-18 DIAGNOSIS — J441 Chronic obstructive pulmonary disease with (acute) exacerbation: Secondary | ICD-10-CM | POA: Diagnosis not present

## 2021-03-18 DIAGNOSIS — Z87891 Personal history of nicotine dependence: Secondary | ICD-10-CM | POA: Diagnosis not present

## 2021-03-18 DIAGNOSIS — R0689 Other abnormalities of breathing: Secondary | ICD-10-CM | POA: Diagnosis not present

## 2021-03-18 DIAGNOSIS — R069 Unspecified abnormalities of breathing: Secondary | ICD-10-CM | POA: Diagnosis not present

## 2021-03-18 DIAGNOSIS — Z20822 Contact with and (suspected) exposure to covid-19: Secondary | ICD-10-CM | POA: Diagnosis not present

## 2021-03-18 DIAGNOSIS — Z28310 Unvaccinated for covid-19: Secondary | ICD-10-CM | POA: Diagnosis not present

## 2021-03-18 DIAGNOSIS — R000 Tachycardia, unspecified: Secondary | ICD-10-CM | POA: Diagnosis not present

## 2021-03-18 DIAGNOSIS — R059 Cough, unspecified: Secondary | ICD-10-CM | POA: Diagnosis not present

## 2021-03-24 DIAGNOSIS — J440 Chronic obstructive pulmonary disease with acute lower respiratory infection: Secondary | ICD-10-CM | POA: Diagnosis not present

## 2021-03-24 DIAGNOSIS — Z299 Encounter for prophylactic measures, unspecified: Secondary | ICD-10-CM | POA: Diagnosis not present

## 2021-03-24 DIAGNOSIS — J209 Acute bronchitis, unspecified: Secondary | ICD-10-CM | POA: Diagnosis not present

## 2021-03-24 DIAGNOSIS — I700 Atherosclerosis of aorta: Secondary | ICD-10-CM | POA: Diagnosis not present

## 2021-03-24 DIAGNOSIS — I10 Essential (primary) hypertension: Secondary | ICD-10-CM | POA: Diagnosis not present

## 2021-04-11 ENCOUNTER — Emergency Department (HOSPITAL_COMMUNITY): Payer: Medicare Other

## 2021-04-11 ENCOUNTER — Other Ambulatory Visit: Payer: Self-pay

## 2021-04-11 ENCOUNTER — Encounter (HOSPITAL_COMMUNITY): Payer: Self-pay | Admitting: Emergency Medicine

## 2021-04-11 ENCOUNTER — Emergency Department (HOSPITAL_COMMUNITY)
Admission: EM | Admit: 2021-04-11 | Discharge: 2021-04-11 | Disposition: A | Discharge status: Home / Self Care | Payer: Medicare Other | Attending: Emergency Medicine | Admitting: Emergency Medicine

## 2021-04-11 DIAGNOSIS — Z20822 Contact with and (suspected) exposure to covid-19: Secondary | ICD-10-CM | POA: Insufficient documentation

## 2021-04-11 DIAGNOSIS — Z87891 Personal history of nicotine dependence: Secondary | ICD-10-CM | POA: Diagnosis not present

## 2021-04-11 DIAGNOSIS — J189 Pneumonia, unspecified organism: Secondary | ICD-10-CM

## 2021-04-11 DIAGNOSIS — Z96652 Presence of left artificial knee joint: Secondary | ICD-10-CM | POA: Insufficient documentation

## 2021-04-11 DIAGNOSIS — R1084 Generalized abdominal pain: Secondary | ICD-10-CM

## 2021-04-11 DIAGNOSIS — J441 Chronic obstructive pulmonary disease with (acute) exacerbation: Secondary | ICD-10-CM | POA: Insufficient documentation

## 2021-04-11 DIAGNOSIS — N200 Calculus of kidney: Secondary | ICD-10-CM | POA: Diagnosis not present

## 2021-04-11 DIAGNOSIS — R069 Unspecified abnormalities of breathing: Secondary | ICD-10-CM | POA: Diagnosis not present

## 2021-04-11 DIAGNOSIS — Z6821 Body mass index (BMI) 21.0-21.9, adult: Secondary | ICD-10-CM | POA: Diagnosis not present

## 2021-04-11 DIAGNOSIS — M545 Low back pain, unspecified: Secondary | ICD-10-CM | POA: Diagnosis not present

## 2021-04-11 DIAGNOSIS — R000 Tachycardia, unspecified: Secondary | ICD-10-CM | POA: Diagnosis not present

## 2021-04-11 DIAGNOSIS — R0602 Shortness of breath: Secondary | ICD-10-CM | POA: Diagnosis not present

## 2021-04-11 DIAGNOSIS — R109 Unspecified abdominal pain: Secondary | ICD-10-CM | POA: Diagnosis not present

## 2021-04-11 DIAGNOSIS — R0902 Hypoxemia: Secondary | ICD-10-CM | POA: Diagnosis not present

## 2021-04-11 DIAGNOSIS — R0689 Other abnormalities of breathing: Secondary | ICD-10-CM | POA: Diagnosis not present

## 2021-04-11 DIAGNOSIS — Z299 Encounter for prophylactic measures, unspecified: Secondary | ICD-10-CM | POA: Diagnosis not present

## 2021-04-11 DIAGNOSIS — I10 Essential (primary) hypertension: Secondary | ICD-10-CM | POA: Diagnosis not present

## 2021-04-11 DIAGNOSIS — R079 Chest pain, unspecified: Secondary | ICD-10-CM | POA: Diagnosis not present

## 2021-04-11 LAB — URINALYSIS, ROUTINE W REFLEX MICROSCOPIC
Bacteria, UA: NONE SEEN
Bilirubin Urine: NEGATIVE
Glucose, UA: NEGATIVE mg/dL
Ketones, ur: NEGATIVE mg/dL
Leukocytes,Ua: NEGATIVE
Nitrite: NEGATIVE
Protein, ur: 30 mg/dL — AB
Specific Gravity, Urine: >1.046 — ABNORMAL HIGH (ref 1.005–1.030)
pH: 5 (ref 5.0–8.0)

## 2021-04-11 LAB — CBC WITH DIFFERENTIAL/PLATELET
Abs Immature Granulocytes: 0.04 10*3/uL (ref 0.00–0.07)
Basophils Absolute: 0.1 10*3/uL (ref 0.0–0.1)
Basophils Relative: 0 %
Eosinophils Absolute: 0 10*3/uL (ref 0.0–0.5)
Eosinophils Relative: 0 %
HCT: 44.5 % (ref 39.0–52.0)
Hemoglobin: 14.7 g/dL (ref 13.0–17.0)
Immature Granulocytes: 0 %
Lymphocytes Relative: 2 %
Lymphs Abs: 0.3 10*3/uL — ABNORMAL LOW (ref 0.7–4.0)
MCH: 31.8 pg (ref 26.0–34.0)
MCHC: 33 g/dL (ref 30.0–36.0)
MCV: 96.3 fL (ref 80.0–100.0)
Monocytes Absolute: 1.6 10*3/uL — ABNORMAL HIGH (ref 0.1–1.0)
Monocytes Relative: 13 %
Neutro Abs: 10.5 10*3/uL — ABNORMAL HIGH (ref 1.7–7.7)
Neutrophils Relative %: 85 %
Platelets: 159 10*3/uL (ref 150–400)
RBC: 4.62 MIL/uL (ref 4.22–5.81)
RDW: 14.5 % (ref 11.5–15.5)
WBC: 12.4 10*3/uL — ABNORMAL HIGH (ref 4.0–10.5)
nRBC: 0 % (ref 0.0–0.2)

## 2021-04-11 LAB — COMPREHENSIVE METABOLIC PANEL
ALT: 13 U/L (ref 0–44)
AST: 16 U/L (ref 15–41)
Albumin: 3.6 g/dL (ref 3.5–5.0)
Alkaline Phosphatase: 68 U/L (ref 38–126)
Anion gap: 11 (ref 5–15)
BUN: 40 mg/dL — ABNORMAL HIGH (ref 8–23)
CO2: 27 mmol/L (ref 22–32)
Calcium: 9.1 mg/dL (ref 8.9–10.3)
Chloride: 98 mmol/L (ref 98–111)
Creatinine, Ser: 1.33 mg/dL — ABNORMAL HIGH (ref 0.61–1.24)
GFR, Estimated: 54 mL/min — ABNORMAL LOW (ref >60)
Glucose, Bld: 150 mg/dL — ABNORMAL HIGH (ref 70–99)
Potassium: 5.2 mmol/L — ABNORMAL HIGH (ref 3.5–5.1)
Sodium: 136 mmol/L (ref 135–145)
Total Bilirubin: 1.2 mg/dL (ref 0.3–1.2)
Total Protein: 7 g/dL (ref 6.5–8.1)

## 2021-04-11 LAB — RESP PANEL BY RT-PCR (FLU A&B, COVID) ARPGX2
Influenza A by PCR: NEGATIVE
Influenza B by PCR: NEGATIVE
SARS Coronavirus 2 by RT PCR: NEGATIVE

## 2021-04-11 LAB — LIPASE, BLOOD: Lipase: 31 U/L (ref 11–51)

## 2021-04-11 MED ORDER — SODIUM CHLORIDE 0.9 % IV SOLN
1.0000 g | Freq: Once | INTRAVENOUS | Status: AC
  Administered 2021-04-11: 1 g via INTRAVENOUS
  Filled 2021-04-11: qty 10

## 2021-04-11 MED ORDER — ALBUTEROL SULFATE (2.5 MG/3ML) 0.083% IN NEBU
5.0000 mg | INHALATION_SOLUTION | Freq: Once | RESPIRATORY_TRACT | Status: AC
  Administered 2021-04-11: 5 mg via RESPIRATORY_TRACT
  Filled 2021-04-11: qty 6

## 2021-04-11 MED ORDER — SODIUM CHLORIDE 0.9 % IV SOLN
500.0000 mg | INTRAVENOUS | Status: DC
  Administered 2021-04-11: 500 mg via INTRAVENOUS
  Filled 2021-04-11: qty 500

## 2021-04-11 MED ORDER — SODIUM CHLORIDE 0.9 % IV SOLN: INTRAVENOUS | Status: DC

## 2021-04-11 MED ORDER — AMOXICILLIN-POT CLAVULANATE 875-125 MG PO TABS: 1.0000 | ORAL_TABLET | Freq: Two times a day (BID) | ORAL | 0 refills | Status: AC

## 2021-04-11 MED ORDER — SODIUM CHLORIDE 0.9 % IV BOLUS: 500.0000 mL | Freq: Once | INTRAVENOUS | Status: DC

## 2021-04-11 MED ORDER — PREDNISONE 50 MG PO TABS
60.0000 mg | ORAL_TABLET | Freq: Once | ORAL | Status: AC
  Administered 2021-04-11: 60 mg via ORAL
  Filled 2021-04-11: qty 1

## 2021-04-11 MED ORDER — SODIUM CHLORIDE 0.9 % IV BOLUS
500.0000 mL | Freq: Once | INTRAVENOUS | Status: AC
  Administered 2021-04-11: 500 mL via INTRAVENOUS

## 2021-04-11 MED ORDER — IOHEXOL 350 MG/ML SOLN
100.0000 mL | Freq: Once | INTRAVENOUS | Status: AC | PRN
  Administered 2021-04-11: 100 mL via INTRAVENOUS

## 2021-04-11 NOTE — ED Provider Notes (Signed)
4:40 PM-checkout from Dr. Rubin Payor to evaluate patient after testing returns.  He presented with malaise present for 2 weeks, despite prior evaluation and treatment.  He has COPD is on chronic oxygen.  He is having postprandial abdominal discomfort.  At the time of disposition, pending tests include CT chest to rule out PE and CT abdomen pelvis, as well as a urinalysis  6:25 PM-CT scans returned: Acute abnormality present for bilateral pneumonia, unspecified appearance.  6:35 PM-patient lying in bed, fully clothed, off oxygen, not on any type of monitoring.  Family members at the bedside.  At this time patient is tachypneic with pursed lip breathing.  I placed his oxygen saturation monitor on his left third finger and the saturation was 66%.  I replaced him on nasal cannula oxygen at 4 L and his saturation improved to the high 70s fairly rapidly.  At this time he has poor air movement and intercostal retractions with breathing.  He is not in frank respiratory distress.  7:40 PM-he is alert and comfortable and states he feels well enough to go home.  Currently oxygenation is 88% while on nebulizer, with oxygen.  Antibiotics are infusing.  Will give second dose of saline bolus.  9:35 PM-he is comfortable at this time.  Not current Cardiac monitor however most recent values are reassuring with mild tachycardia and normal oxygenation on 4 L nasal cannula oxygen.  He states he is ready to go home.  We will send prescription for Augmentin to his pharmacy.  He is encouraged to resume his usual medications and follow-up with his PCP for checkup in 3 days   Mancel Bale, MD 04/11/21 2138

## 2021-04-11 NOTE — Discharge Instructions (Signed)
Start the antibiotic prescription tomorrow morning.  Take all of your medicines and use your oxygen as usual.  See your doctor for checkup on Monday or Tuesday.  Return here if needed

## 2021-04-11 NOTE — ED Triage Notes (Signed)
Pt arrived via [facility]. Pt c/o SOB. Pt hx of COPD. Pt received 125mg  solumedrol, 2.5 albuterol, 0.5 on atrovent. Pt 94% on 4 L.

## 2021-04-11 NOTE — ED Provider Notes (Signed)
[facility] EMERGENCY DEPARTMENT Provider Note   CSN: [id number] Arrival date & time: 04/11/21  1318     History Chief Complaint  Patient presents with   Shortness of Breath    [name] [name] is a 80 y.o. male.   Shortness of Breath Associated symptoms: abdominal pain and cough   Associated symptoms: no chest pain and no rash   Patient presents with shortness of breath.  Has had for the last couple weeks now.  States he was seen at [facility] and they did not really do anything for him.  States he felt as though something wrong but they did not do much.  No nausea or vomiting currently but states he has had some abdominal pain over the last few weeks also.  He is on chronic oxygen at around 3 and half liters.  Sometimes less during the day.  States he was told increasing oxygen would mean that he would give him too much carbon dioxide.  Pain had been dull in the abdomen.  States he has had decreased oral intake.  States he has not had an appetite.  States also when he ate there would be some pain.  Sees Dr. Sherril Croon.    Past Medical History:  Diagnosis Date   Arthritis    knees and back    COPD (chronic obstructive pulmonary disease) (HCC)    dr Duanne Guess    [place]   Depression    ptsd    Impingement syndrome of right shoulder 07/06/2013   On supplemental oxygen therapy    2 liters if needed   Pneumonia    PTSD (post-traumatic stress disorder)    pt was a POW x 23yrs   Shortness of breath    with exertion    Shoulder arthritis, Right  07/06/2013    Patient Active Problem List   Diagnosis Date Noted   Exertional dyspnea 09/04/2016   Hypoxemia 05/06/2016   PTSD (post-traumatic stress disorder) 03/05/2016   Chest pain 12/02/2015   Chronic respiratory failure with hypoxia (HCC) 06/18/2015   COPD (chronic obstructive pulmonary disease) (HCC) 06/17/2015   Shoulder arthritis, Right  07/06/2013   Impingement syndrome of right shoulder 07/06/2013   Nonunion, 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   Depression 02/18/2012   OA (osteoarthritis) of knee 10/19/2011    Past Surgical History:  Procedure Laterality Date   APPENDECTOMY     CARPAL TUNNEL RELEASE     right    CERVICAL FUSION     COLONOSCOPY     EYE SURGERY     FOOT SURGERY Left    no "ball" on foot   HARDWARE REMOVAL  02/16/2012   Procedure: HARDWARE REMOVAL;  Surgeon: Budd Palmer, MD;  Location: [facility] OR;  Service: Orthopedics;  Laterality: Right;  removal of hardware from right clavicle   HARDWARE REMOVAL Right 07/04/2013   Procedure: RIGHT HARDWARE REMOVAL FROM CLAVICAL;  Surgeon: Budd Palmer, MD;  Location: [facility] OR;  Service: Orthopedics;  Laterality: Right;   HARVEST BONE GRAFT  02/16/2012   Procedure: HARVEST ILIAC BONE GRAFT;  Surgeon: Budd Palmer, MD;  Location: [facility] OR;  Service: Orthopedics;  Laterality: Right;  right iliac bone graft harvest   HEMORRHOID SURGERY     HEMORRHOID SURGERY     x 2   JOINT REPLACEMENT  2013   left knee replaced .   KNEE ARTHROSCOPY Bilateral    NOSE SURGERY     ORIF CLAVICULAR  FRACTURE  02/16/2012   Procedure: OPEN REDUCTION INTERNAL FIXATION (ORIF) CLAVICULAR FRACTURE;  Surgeon: Budd Palmer, MD;  Location: [facility] OR;  Service: Orthopedics;  Laterality: Right;  Removal of hardware, ORIF of Clavicle, and allograft bone harvesting.    ORIF CLAVICULAR FRACTURE Right 12/30/2012   Procedure: RIGHT CLAVICLE NON UNION REPAIR SUBACROMICAL INJECTION ;  Surgeon: Budd Palmer, MD;  Location: [facility] OR;  Service: Orthopedics;  Laterality: Right;   OTHER SURGICAL HISTORY     cyst removed under left ear  and under right arm    OTHER SURGICAL HISTORY     left leg surgery    OTHER SURGICAL HISTORY     right leg surgery    OTHER SURGICAL HISTORY     right facial surgery    OTHER SURGICAL HISTORY     nasal surgery    OTHER SURGICAL HISTORY     left foot surgey and left great toe surgery    OTHER SURGICAL HISTORY     right collar bone  surgery    PILONIDAL CYST / SINUS EXCISION     SHOULDER ARTHROSCOPY WITH SUBACROMIAL DECOMPRESSION Right 07/04/2013   Procedure: RIGHT SHOULDER ARTHROSCOPY WITH SUBACROMIAL DECOMPRESSION ;  Surgeon: Budd Palmer, MD;  Location: [facility] OR;  Service: Orthopedics;  Laterality: Right;   SHOULDER SURGERY Right    SUBDURAL HEMATOMA EVACUATION VIA CRANIOTOMY     TOTAL KNEE ARTHROPLASTY  10/19/2011   Procedure: TOTAL KNEE ARTHROPLASTY;  Surgeon: Loanne Drilling, MD;  Location: [facility] ORS;  Service: Orthopedics;  Laterality: Left;       Family History  Problem Relation Age of Onset   Breast cancer Mother    Esophageal cancer Mother    Prostate cancer Father     Social History   Tobacco Use   Smoking status: Former    Packs/day: 2.00    Years: 28.00    Pack years: 56.00    Types: Cigarettes    Quit date: 06/02/1987    Years since quitting: 33.8   Smokeless tobacco: Former  Substance Use Topics   Alcohol use: No    Alcohol/week: 0.0 standard drinks    Comment: quit 1989   Drug use: No    Home Medications Prior to Admission medications   Medication Sig Start Date End Date Taking? Authorizing Provider  citalopram (CELEXA) 20 MG tablet Take 20 mg by mouth daily. 03/24/21  Yes [provider]  oxyCODONE-acetaminophen (PERCOCET) 10-325 MG tablet Take 1 tablet by mouth 4 (four) times daily as needed for pain. 04/11/21  Yes [provider]  predniSONE (DELTASONE) 20 MG tablet Take 20 mg by mouth daily. 03/18/21  Yes [provider]  UNABLE TO FIND BIPAP with 2lpm o2  O2 during the day PRN [employer]   Yes [provider]  UNABLE TO FIND Med Name: CBD   Yes [provider]  UNABLE TO FIND Med Name: lung clear   Yes [provider]  albuterol (PROAIR HFA) 108 (90 Base) MCG/ACT inhaler Inhale 2 puffs into the lungs every 6 (six) hours as needed for wheezing or shortness of breath. Patient not taking: No sig reported 10/29/17   Coralyn Helling, MD   STIOLTO RESPIMAT 2.5-2.5 MCG/ACT AERS INHALE 2 PUFFS INTO LUNGS DAILY Patient not taking: No sig reported 03/22/17   Nyoka Cowden, MD    Allergies    Patient has no known allergies.  Review of Systems   Review of Systems  Constitutional:  Positive for appetite  change.  HENT:  Negative for congestion.   Respiratory:  Positive for cough and shortness of breath.   Cardiovascular:  Negative for chest pain.  Gastrointestinal:  Positive for abdominal pain.  Genitourinary:  Negative for flank pain.  Musculoskeletal:  Negative for back pain.  Skin:  Negative for rash.  Neurological:  Negative for weakness.  Psychiatric/Behavioral:  Negative for confusion.    Physical Exam Updated Vital Signs BP (!) 143/93   Pulse (!) 109   Temp 98.7 F (37.1 C) (Oral)   Resp (!) 36   Ht 6\' 3"  (1.905 m)   Wt 68 kg   SpO2 99%   BMI 18.75 kg/m   Physical Exam Vitals and nursing note reviewed.  HENT:     Head: Normocephalic.  Cardiovascular:     Rate and Rhythm: Regular rhythm.  Pulmonary:     Comments: Harsh breath sounds diffusely.  Some tachypnea and pursed lip breathing.  Chronic oxygen. Abdominal:     Comments: Some diffuse abdominal tenderness.  No rebound or guarding.  No hernia palpated  Musculoskeletal:     Cervical back: Neck supple.     Right lower leg: No edema.     Left lower leg: No edema.  Skin:    General: Skin is warm.     Capillary Refill: Capillary refill takes less than 2 seconds.  Neurological:     Mental Status: He is alert and oriented to person, place, and time.    ED Results / Procedures / Treatments   Labs (all labs ordered are listed, but only abnormal results are displayed) Labs Reviewed  CBC WITH DIFFERENTIAL/PLATELET - Abnormal; Notable for the following components:      Result Value   WBC 12.4 (*)    Neutro Abs 10.5 (*)    Lymphs Abs 0.3 (*)    Monocytes Absolute 1.6 (*)    All other components within normal limits  COMPREHENSIVE METABOLIC PANEL  - Abnormal; Notable for the following components:   Potassium 5.2 (*)    Glucose, Bld 150 (*)    BUN 40 (*)    Creatinine, Ser 1.33 (*)    GFR, Estimated 54 (*)    All other components within normal limits  LIPASE, BLOOD  URINALYSIS, ROUTINE W REFLEX MICROSCOPIC    EKG EKG Interpretation  Date/Time:  Friday April 11 2021 13:27:35 EST Ventricular Rate:  119 PR Interval:  151 QRS Duration: 85 QT Interval:  286 QTC Calculation: 403 R Axis:   72 Text Interpretation: Sinus tachycardia Atrial premature complex LAE, consider biatrial enlargement Anterior infarct, old Baseline wander in lead(s) I III aVL Confirmed by [birth year]1985 [phone]) on 04/11/2021 3:34:36 PM  Radiology DG Chest Portable 1 View  Result Date: 04/11/2021 CLINICAL DATA:  Shortness of breath.  History of COPD. EXAM: PORTABLE CHEST 1 VIEW COMPARISON:  Radiographs 03/18/2021 and 12/02/2015.  CT 08/02/2019. FINDINGS: 1413 hours. Two views obtained. The heart size and mediastinal contours are stable with mild aortic atherosclerosis. The lungs are hyperinflated. There is mildly increased interstitial prominence at both lung bases. The pulmonary vascularity appears normal. No confluent airspace opacity, pleural effusion or pneumothorax identified. Posttraumatic deformity of the distal right clavicle and postsurgical changes in the lower cervical spine are noted. IMPRESSION: Interval mildly increased interstitial prominence at both lung bases which could be inflammatory or indicative of mild interstitial edema. No confluent airspace opacity. Electronically Signed   By: 10/02/2019 M.D.   On: 04/11/2021 14:26    Procedures Procedures  Medications Ordered in ED Medications  iohexol (OMNIPAQUE) 350 MG/ML injection 100 mL (has no administration in time range)    ED Course  I have reviewed the triage vital signs and the nursing notes.  Pertinent labs & imaging results that were available during my care of the patient  were reviewed by me and considered in my medical decision making (see chart for details).    MDM Rules/Calculators/A&P                           Patient with cough and shortness of breath.  Has had for last couple weeks.  History of COPD.  Had been seen at [facility] 3 weeks ago.  Had been given steroids and doxycycline.  States symptoms are really not improved.  Has had some abdominal pain 2.  Has a tachycardia.  Decreased oral intake.  States did not eat much over the last 4 days because decreased appetite and says it will hurt if he eats.  Lab work shows likely dehydration.  Creatinine mildly increased.  Chest x-ray showed interstitial prominences.  Could be edema.  Will get CT scan of chest for PE and abdomen pelvis for the abdominal pain that has been having.  Care will be turned over to Dr. Effie Shy Final Clinical Impression(s) / ED Diagnoses Final diagnoses:  COPD exacerbation (HCC)  Generalized abdominal pain    Rx / DC Orders ED Discharge Orders     None        Benjiman Core, MD 04/11/21 1537

## 2021-04-12 DIAGNOSIS — J189 Pneumonia, unspecified organism: Secondary | ICD-10-CM | POA: Diagnosis not present

## 2021-04-12 DIAGNOSIS — Z20822 Contact with and (suspected) exposure to covid-19: Secondary | ICD-10-CM | POA: Diagnosis not present

## 2021-04-12 DIAGNOSIS — A419 Sepsis, unspecified organism: Secondary | ICD-10-CM | POA: Diagnosis not present

## 2021-04-12 DIAGNOSIS — R4020 Unspecified coma: Secondary | ICD-10-CM | POA: Diagnosis not present

## 2021-04-12 DIAGNOSIS — J9691 Respiratory failure, unspecified with hypoxia: Secondary | ICD-10-CM | POA: Diagnosis not present

## 2021-04-12 DIAGNOSIS — J439 Emphysema, unspecified: Secondary | ICD-10-CM | POA: Diagnosis not present

## 2021-04-12 DIAGNOSIS — J449 Chronic obstructive pulmonary disease, unspecified: Secondary | ICD-10-CM | POA: Diagnosis not present

## 2021-04-12 DIAGNOSIS — R000 Tachycardia, unspecified: Secondary | ICD-10-CM | POA: Diagnosis not present

## 2021-04-12 DIAGNOSIS — R918 Other nonspecific abnormal finding of lung field: Secondary | ICD-10-CM | POA: Diagnosis not present

## 2021-04-12 DIAGNOSIS — I214 Non-ST elevation (NSTEMI) myocardial infarction: Secondary | ICD-10-CM | POA: Diagnosis not present

## 2021-04-12 DIAGNOSIS — R404 Transient alteration of awareness: Secondary | ICD-10-CM | POA: Diagnosis not present

## 2021-04-12 DIAGNOSIS — Z87891 Personal history of nicotine dependence: Secondary | ICD-10-CM | POA: Diagnosis not present

## 2021-04-12 DIAGNOSIS — J80 Acute respiratory distress syndrome: Secondary | ICD-10-CM | POA: Diagnosis not present

## 2021-04-12 DIAGNOSIS — I10 Essential (primary) hypertension: Secondary | ICD-10-CM | POA: Diagnosis not present

## 2021-04-12 DIAGNOSIS — R0602 Shortness of breath: Secondary | ICD-10-CM | POA: Diagnosis not present

## 2021-04-12 DIAGNOSIS — R410 Disorientation, unspecified: Secondary | ICD-10-CM | POA: Diagnosis not present

## 2021-04-12 DIAGNOSIS — R9431 Abnormal electrocardiogram [ECG] [EKG]: Secondary | ICD-10-CM | POA: Diagnosis not present

## 2021-04-12 DIAGNOSIS — R0902 Hypoxemia: Secondary | ICD-10-CM | POA: Diagnosis not present

## 2021-04-13 DIAGNOSIS — R9082 White matter disease, unspecified: Secondary | ICD-10-CM | POA: Diagnosis not present

## 2021-04-13 DIAGNOSIS — R4182 Altered mental status, unspecified: Secondary | ICD-10-CM | POA: Diagnosis not present

## 2021-04-13 DIAGNOSIS — K0889 Other specified disorders of teeth and supporting structures: Secondary | ICD-10-CM | POA: Diagnosis present

## 2021-04-13 DIAGNOSIS — R262 Difficulty in walking, not elsewhere classified: Secondary | ICD-10-CM | POA: Diagnosis present

## 2021-04-13 DIAGNOSIS — I5021 Acute systolic (congestive) heart failure: Secondary | ICD-10-CM | POA: Diagnosis present

## 2021-04-13 DIAGNOSIS — M1990 Unspecified osteoarthritis, unspecified site: Secondary | ICD-10-CM | POA: Diagnosis present

## 2021-04-13 DIAGNOSIS — T380X5A Adverse effect of glucocorticoids and synthetic analogues, initial encounter: Secondary | ICD-10-CM | POA: Diagnosis present

## 2021-04-13 DIAGNOSIS — F4310 Post-traumatic stress disorder, unspecified: Secondary | ICD-10-CM | POA: Diagnosis present

## 2021-04-13 DIAGNOSIS — J449 Chronic obstructive pulmonary disease, unspecified: Secondary | ICD-10-CM | POA: Diagnosis not present

## 2021-04-13 DIAGNOSIS — R7989 Other specified abnormal findings of blood chemistry: Secondary | ICD-10-CM | POA: Diagnosis not present

## 2021-04-13 DIAGNOSIS — G9389 Other specified disorders of brain: Secondary | ICD-10-CM | POA: Diagnosis not present

## 2021-04-13 DIAGNOSIS — A419 Sepsis, unspecified organism: Secondary | ICD-10-CM | POA: Diagnosis present

## 2021-04-13 DIAGNOSIS — R9089 Other abnormal findings on diagnostic imaging of central nervous system: Secondary | ICD-10-CM | POA: Diagnosis not present

## 2021-04-13 DIAGNOSIS — Z91198 Patient's noncompliance with other medical treatment and regimen for other reason: Secondary | ICD-10-CM | POA: Diagnosis not present

## 2021-04-13 DIAGNOSIS — J9611 Chronic respiratory failure with hypoxia: Secondary | ICD-10-CM | POA: Diagnosis present

## 2021-04-13 DIAGNOSIS — F339 Major depressive disorder, recurrent, unspecified: Secondary | ICD-10-CM | POA: Diagnosis present

## 2021-04-13 DIAGNOSIS — J9622 Acute and chronic respiratory failure with hypercapnia: Secondary | ICD-10-CM | POA: Diagnosis present

## 2021-04-13 DIAGNOSIS — G4733 Obstructive sleep apnea (adult) (pediatric): Secondary | ICD-10-CM | POA: Diagnosis present

## 2021-04-13 DIAGNOSIS — G9341 Metabolic encephalopathy: Secondary | ICD-10-CM | POA: Diagnosis present

## 2021-04-13 DIAGNOSIS — I214 Non-ST elevation (NSTEMI) myocardial infarction: Secondary | ICD-10-CM | POA: Diagnosis present

## 2021-04-13 DIAGNOSIS — R930 Abnormal findings on diagnostic imaging of skull and head, not elsewhere classified: Secondary | ICD-10-CM | POA: Diagnosis not present

## 2021-04-13 DIAGNOSIS — I5189 Other ill-defined heart diseases: Secondary | ICD-10-CM | POA: Diagnosis not present

## 2021-04-13 DIAGNOSIS — I429 Cardiomyopathy, unspecified: Secondary | ICD-10-CM | POA: Diagnosis present

## 2021-04-13 DIAGNOSIS — Z9981 Dependence on supplemental oxygen: Secondary | ICD-10-CM | POA: Diagnosis not present

## 2021-04-13 DIAGNOSIS — F32A Depression, unspecified: Secondary | ICD-10-CM | POA: Diagnosis present

## 2021-04-13 DIAGNOSIS — R0602 Shortness of breath: Secondary | ICD-10-CM | POA: Diagnosis not present

## 2021-04-13 DIAGNOSIS — G9340 Encephalopathy, unspecified: Secondary | ICD-10-CM | POA: Diagnosis present

## 2021-04-13 DIAGNOSIS — J9621 Acute and chronic respiratory failure with hypoxia: Secondary | ICD-10-CM | POA: Diagnosis present

## 2021-04-13 DIAGNOSIS — J9612 Chronic respiratory failure with hypercapnia: Secondary | ICD-10-CM | POA: Diagnosis not present

## 2021-04-13 DIAGNOSIS — I6782 Cerebral ischemia: Secondary | ICD-10-CM | POA: Diagnosis not present

## 2021-04-13 DIAGNOSIS — J159 Unspecified bacterial pneumonia: Secondary | ICD-10-CM | POA: Diagnosis present

## 2021-04-13 DIAGNOSIS — R0689 Other abnormalities of breathing: Secondary | ICD-10-CM | POA: Diagnosis not present

## 2021-04-13 DIAGNOSIS — J9691 Respiratory failure, unspecified with hypoxia: Secondary | ICD-10-CM | POA: Diagnosis not present

## 2021-04-13 DIAGNOSIS — Z7982 Long term (current) use of aspirin: Secondary | ICD-10-CM | POA: Diagnosis not present

## 2021-04-13 DIAGNOSIS — I080 Rheumatic disorders of both mitral and aortic valves: Secondary | ICD-10-CM | POA: Diagnosis not present

## 2021-04-13 DIAGNOSIS — R918 Other nonspecific abnormal finding of lung field: Secondary | ICD-10-CM | POA: Diagnosis not present

## 2021-04-13 DIAGNOSIS — E039 Hypothyroidism, unspecified: Secondary | ICD-10-CM | POA: Diagnosis present

## 2021-04-13 DIAGNOSIS — I959 Hypotension, unspecified: Secondary | ICD-10-CM | POA: Diagnosis present

## 2021-04-13 DIAGNOSIS — R488 Other symbolic dysfunctions: Secondary | ICD-10-CM | POA: Diagnosis present

## 2021-04-13 DIAGNOSIS — I10 Essential (primary) hypertension: Secondary | ICD-10-CM | POA: Diagnosis present

## 2021-04-13 DIAGNOSIS — D72829 Elevated white blood cell count, unspecified: Secondary | ICD-10-CM | POA: Diagnosis present

## 2021-04-13 DIAGNOSIS — F4312 Post-traumatic stress disorder, chronic: Secondary | ICD-10-CM | POA: Diagnosis present

## 2021-04-13 DIAGNOSIS — J189 Pneumonia, unspecified organism: Secondary | ICD-10-CM | POA: Diagnosis present

## 2021-04-13 DIAGNOSIS — R1310 Dysphagia, unspecified: Secondary | ICD-10-CM | POA: Diagnosis present

## 2021-04-13 DIAGNOSIS — J441 Chronic obstructive pulmonary disease with (acute) exacerbation: Secondary | ICD-10-CM | POA: Diagnosis present

## 2021-04-13 DIAGNOSIS — T465X5A Adverse effect of other antihypertensive drugs, initial encounter: Secondary | ICD-10-CM | POA: Diagnosis present

## 2021-04-13 DIAGNOSIS — Z9989 Dependence on other enabling machines and devices: Secondary | ICD-10-CM | POA: Diagnosis not present

## 2021-04-13 DIAGNOSIS — M6281 Muscle weakness (generalized): Secondary | ICD-10-CM | POA: Diagnosis present

## 2021-04-23 DIAGNOSIS — F4310 Post-traumatic stress disorder, unspecified: Secondary | ICD-10-CM | POA: Diagnosis not present

## 2021-04-23 DIAGNOSIS — G319 Degenerative disease of nervous system, unspecified: Secondary | ICD-10-CM | POA: Diagnosis not present

## 2021-04-23 DIAGNOSIS — G4733 Obstructive sleep apnea (adult) (pediatric): Secondary | ICD-10-CM | POA: Diagnosis not present

## 2021-04-23 DIAGNOSIS — K5730 Diverticulosis of large intestine without perforation or abscess without bleeding: Secondary | ICD-10-CM | POA: Diagnosis not present

## 2021-04-23 DIAGNOSIS — F4312 Post-traumatic stress disorder, chronic: Secondary | ICD-10-CM | POA: Diagnosis not present

## 2021-04-23 DIAGNOSIS — I5020 Unspecified systolic (congestive) heart failure: Secondary | ICD-10-CM | POA: Diagnosis not present

## 2021-04-23 DIAGNOSIS — Z9981 Dependence on supplemental oxygen: Secondary | ICD-10-CM | POA: Diagnosis not present

## 2021-04-23 DIAGNOSIS — J9691 Respiratory failure, unspecified with hypoxia: Secondary | ICD-10-CM | POA: Diagnosis not present

## 2021-04-23 DIAGNOSIS — T68XXXA Hypothermia, initial encounter: Secondary | ICD-10-CM | POA: Diagnosis not present

## 2021-04-23 DIAGNOSIS — R52 Pain, unspecified: Secondary | ICD-10-CM | POA: Diagnosis not present

## 2021-04-23 DIAGNOSIS — R9431 Abnormal electrocardiogram [ECG] [EKG]: Secondary | ICD-10-CM | POA: Diagnosis not present

## 2021-04-23 DIAGNOSIS — R4182 Altered mental status, unspecified: Secondary | ICD-10-CM | POA: Diagnosis not present

## 2021-04-23 DIAGNOSIS — J9690 Respiratory failure, unspecified, unspecified whether with hypoxia or hypercapnia: Secondary | ICD-10-CM | POA: Diagnosis not present

## 2021-04-23 DIAGNOSIS — R918 Other nonspecific abnormal finding of lung field: Secondary | ICD-10-CM | POA: Diagnosis not present

## 2021-04-23 DIAGNOSIS — Z79899 Other long term (current) drug therapy: Secondary | ICD-10-CM | POA: Diagnosis not present

## 2021-04-23 DIAGNOSIS — E785 Hyperlipidemia, unspecified: Secondary | ICD-10-CM | POA: Diagnosis not present

## 2021-04-23 DIAGNOSIS — R531 Weakness: Secondary | ICD-10-CM | POA: Diagnosis not present

## 2021-04-23 DIAGNOSIS — N179 Acute kidney failure, unspecified: Secondary | ICD-10-CM | POA: Diagnosis not present

## 2021-04-23 DIAGNOSIS — I214 Non-ST elevation (NSTEMI) myocardial infarction: Secondary | ICD-10-CM | POA: Diagnosis not present

## 2021-04-23 DIAGNOSIS — J9611 Chronic respiratory failure with hypoxia: Secondary | ICD-10-CM | POA: Diagnosis not present

## 2021-04-23 DIAGNOSIS — J441 Chronic obstructive pulmonary disease with (acute) exacerbation: Secondary | ICD-10-CM | POA: Diagnosis not present

## 2021-04-23 DIAGNOSIS — Z79890 Hormone replacement therapy (postmenopausal): Secondary | ICD-10-CM | POA: Diagnosis not present

## 2021-04-23 DIAGNOSIS — Z87891 Personal history of nicotine dependence: Secondary | ICD-10-CM | POA: Diagnosis not present

## 2021-04-23 DIAGNOSIS — I6782 Cerebral ischemia: Secondary | ICD-10-CM | POA: Diagnosis not present

## 2021-04-23 DIAGNOSIS — R404 Transient alteration of awareness: Secondary | ICD-10-CM | POA: Diagnosis not present

## 2021-04-23 DIAGNOSIS — R933 Abnormal findings on diagnostic imaging of other parts of digestive tract: Secondary | ICD-10-CM | POA: Diagnosis not present

## 2021-04-23 DIAGNOSIS — J90 Pleural effusion, not elsewhere classified: Secondary | ICD-10-CM | POA: Diagnosis not present

## 2021-04-23 DIAGNOSIS — E876 Hypokalemia: Secondary | ICD-10-CM | POA: Diagnosis not present

## 2021-04-23 DIAGNOSIS — J439 Emphysema, unspecified: Secondary | ICD-10-CM | POA: Diagnosis not present

## 2021-04-23 DIAGNOSIS — N200 Calculus of kidney: Secondary | ICD-10-CM | POA: Diagnosis not present

## 2021-04-23 DIAGNOSIS — J189 Pneumonia, unspecified organism: Secondary | ICD-10-CM | POA: Diagnosis not present

## 2021-04-23 DIAGNOSIS — G9340 Encephalopathy, unspecified: Secondary | ICD-10-CM | POA: Diagnosis not present

## 2021-04-23 DIAGNOSIS — I700 Atherosclerosis of aorta: Secondary | ICD-10-CM | POA: Diagnosis not present

## 2021-04-23 DIAGNOSIS — G9341 Metabolic encephalopathy: Secondary | ICD-10-CM | POA: Diagnosis not present

## 2021-04-23 DIAGNOSIS — M7989 Other specified soft tissue disorders: Secondary | ICD-10-CM | POA: Diagnosis not present

## 2021-04-23 DIAGNOSIS — R1310 Dysphagia, unspecified: Secondary | ICD-10-CM | POA: Diagnosis not present

## 2021-04-23 DIAGNOSIS — R488 Other symbolic dysfunctions: Secondary | ICD-10-CM | POA: Diagnosis not present

## 2021-04-23 DIAGNOSIS — B338 Other specified viral diseases: Secondary | ICD-10-CM | POA: Diagnosis not present

## 2021-04-23 DIAGNOSIS — E869 Volume depletion, unspecified: Secondary | ICD-10-CM | POA: Diagnosis not present

## 2021-04-23 DIAGNOSIS — M549 Dorsalgia, unspecified: Secondary | ICD-10-CM | POA: Diagnosis not present

## 2021-04-23 DIAGNOSIS — J449 Chronic obstructive pulmonary disease, unspecified: Secondary | ICD-10-CM | POA: Diagnosis not present

## 2021-04-23 DIAGNOSIS — J984 Other disorders of lung: Secondary | ICD-10-CM | POA: Diagnosis not present

## 2021-04-23 DIAGNOSIS — A419 Sepsis, unspecified organism: Secondary | ICD-10-CM | POA: Diagnosis not present

## 2021-04-23 DIAGNOSIS — M6281 Muscle weakness (generalized): Secondary | ICD-10-CM | POA: Diagnosis not present

## 2021-04-23 DIAGNOSIS — R930 Abnormal findings on diagnostic imaging of skull and head, not elsewhere classified: Secondary | ICD-10-CM | POA: Diagnosis not present

## 2021-04-23 DIAGNOSIS — J9811 Atelectasis: Secondary | ICD-10-CM | POA: Diagnosis not present

## 2021-04-23 DIAGNOSIS — R410 Disorientation, unspecified: Secondary | ICD-10-CM | POA: Diagnosis not present

## 2021-04-23 DIAGNOSIS — Z9989 Dependence on other enabling machines and devices: Secondary | ICD-10-CM | POA: Diagnosis not present

## 2021-04-23 DIAGNOSIS — R262 Difficulty in walking, not elsewhere classified: Secondary | ICD-10-CM | POA: Diagnosis not present

## 2021-04-23 DIAGNOSIS — F339 Major depressive disorder, recurrent, unspecified: Secondary | ICD-10-CM | POA: Diagnosis not present

## 2021-04-23 DIAGNOSIS — I10 Essential (primary) hypertension: Secondary | ICD-10-CM | POA: Diagnosis not present

## 2021-04-23 DIAGNOSIS — B974 Respiratory syncytial virus as the cause of diseases classified elsewhere: Secondary | ICD-10-CM | POA: Diagnosis not present

## 2021-04-23 DIAGNOSIS — E43 Unspecified severe protein-calorie malnutrition: Secondary | ICD-10-CM | POA: Diagnosis not present

## 2021-04-23 DIAGNOSIS — R0602 Shortness of breath: Secondary | ICD-10-CM | POA: Diagnosis not present

## 2021-04-23 DIAGNOSIS — I429 Cardiomyopathy, unspecified: Secondary | ICD-10-CM | POA: Diagnosis not present

## 2021-04-23 DIAGNOSIS — U071 COVID-19: Secondary | ICD-10-CM | POA: Diagnosis not present

## 2021-04-23 DIAGNOSIS — G9389 Other specified disorders of brain: Secondary | ICD-10-CM | POA: Diagnosis not present

## 2021-04-23 DIAGNOSIS — R0600 Dyspnea, unspecified: Secondary | ICD-10-CM | POA: Diagnosis not present

## 2021-04-23 DIAGNOSIS — E039 Hypothyroidism, unspecified: Secondary | ICD-10-CM | POA: Diagnosis not present

## 2021-04-29 DIAGNOSIS — I5020 Unspecified systolic (congestive) heart failure: Secondary | ICD-10-CM | POA: Diagnosis not present

## 2021-04-29 DIAGNOSIS — I214 Non-ST elevation (NSTEMI) myocardial infarction: Secondary | ICD-10-CM | POA: Diagnosis not present

## 2021-04-29 DIAGNOSIS — I429 Cardiomyopathy, unspecified: Secondary | ICD-10-CM | POA: Diagnosis not present

## 2021-04-29 DIAGNOSIS — E785 Hyperlipidemia, unspecified: Secondary | ICD-10-CM | POA: Diagnosis not present

## 2021-04-29 DIAGNOSIS — J441 Chronic obstructive pulmonary disease with (acute) exacerbation: Secondary | ICD-10-CM | POA: Diagnosis not present

## 2021-04-29 DIAGNOSIS — J189 Pneumonia, unspecified organism: Secondary | ICD-10-CM | POA: Diagnosis not present

## 2021-04-29 DIAGNOSIS — G4733 Obstructive sleep apnea (adult) (pediatric): Secondary | ICD-10-CM | POA: Diagnosis not present

## 2021-04-29 DIAGNOSIS — Z9989 Dependence on other enabling machines and devices: Secondary | ICD-10-CM | POA: Diagnosis not present

## 2021-04-30 DIAGNOSIS — I10 Essential (primary) hypertension: Secondary | ICD-10-CM | POA: Diagnosis not present

## 2021-04-30 DIAGNOSIS — J441 Chronic obstructive pulmonary disease with (acute) exacerbation: Secondary | ICD-10-CM | POA: Diagnosis not present

## 2021-04-30 DIAGNOSIS — E785 Hyperlipidemia, unspecified: Secondary | ICD-10-CM | POA: Diagnosis not present

## 2021-05-02 DIAGNOSIS — I5020 Unspecified systolic (congestive) heart failure: Secondary | ICD-10-CM | POA: Diagnosis not present

## 2021-05-02 DIAGNOSIS — J441 Chronic obstructive pulmonary disease with (acute) exacerbation: Secondary | ICD-10-CM | POA: Diagnosis not present

## 2021-05-09 DIAGNOSIS — E43 Unspecified severe protein-calorie malnutrition: Secondary | ICD-10-CM | POA: Diagnosis not present

## 2021-05-13 DIAGNOSIS — Z87891 Personal history of nicotine dependence: Secondary | ICD-10-CM | POA: Diagnosis not present

## 2021-05-13 DIAGNOSIS — I10 Essential (primary) hypertension: Secondary | ICD-10-CM | POA: Diagnosis not present

## 2021-05-13 DIAGNOSIS — E876 Hypokalemia: Secondary | ICD-10-CM | POA: Diagnosis not present

## 2021-05-13 DIAGNOSIS — J439 Emphysema, unspecified: Secondary | ICD-10-CM | POA: Diagnosis not present

## 2021-05-13 DIAGNOSIS — Z79890 Hormone replacement therapy (postmenopausal): Secondary | ICD-10-CM | POA: Diagnosis not present

## 2021-05-13 DIAGNOSIS — Z79899 Other long term (current) drug therapy: Secondary | ICD-10-CM | POA: Diagnosis not present

## 2021-05-13 DIAGNOSIS — J441 Chronic obstructive pulmonary disease with (acute) exacerbation: Secondary | ICD-10-CM | POA: Diagnosis not present

## 2021-05-13 DIAGNOSIS — R0600 Dyspnea, unspecified: Secondary | ICD-10-CM | POA: Diagnosis not present

## 2021-05-13 DIAGNOSIS — R0602 Shortness of breath: Secondary | ICD-10-CM | POA: Diagnosis not present

## 2021-05-13 DIAGNOSIS — J189 Pneumonia, unspecified organism: Secondary | ICD-10-CM | POA: Diagnosis not present

## 2021-05-13 DIAGNOSIS — R9431 Abnormal electrocardiogram [ECG] [EKG]: Secondary | ICD-10-CM | POA: Diagnosis not present

## 2021-05-13 DIAGNOSIS — F4310 Post-traumatic stress disorder, unspecified: Secondary | ICD-10-CM | POA: Diagnosis not present

## 2021-05-13 DIAGNOSIS — Z9981 Dependence on supplemental oxygen: Secondary | ICD-10-CM | POA: Diagnosis not present

## 2021-05-13 DIAGNOSIS — J449 Chronic obstructive pulmonary disease, unspecified: Secondary | ICD-10-CM | POA: Diagnosis not present

## 2021-05-16 DIAGNOSIS — J189 Pneumonia, unspecified organism: Secondary | ICD-10-CM | POA: Diagnosis not present

## 2021-05-21 DIAGNOSIS — M7989 Other specified soft tissue disorders: Secondary | ICD-10-CM | POA: Diagnosis not present

## 2021-05-21 DIAGNOSIS — J449 Chronic obstructive pulmonary disease, unspecified: Secondary | ICD-10-CM | POA: Diagnosis not present

## 2021-05-21 DIAGNOSIS — K5730 Diverticulosis of large intestine without perforation or abscess without bleeding: Secondary | ICD-10-CM | POA: Diagnosis not present

## 2021-05-21 DIAGNOSIS — J9811 Atelectasis: Secondary | ICD-10-CM | POA: Diagnosis not present

## 2021-05-21 DIAGNOSIS — J90 Pleural effusion, not elsewhere classified: Secondary | ICD-10-CM | POA: Diagnosis not present

## 2021-05-21 DIAGNOSIS — I10 Essential (primary) hypertension: Secondary | ICD-10-CM | POA: Diagnosis not present

## 2021-05-21 DIAGNOSIS — A419 Sepsis, unspecified organism: Secondary | ICD-10-CM | POA: Diagnosis not present

## 2021-05-21 DIAGNOSIS — J189 Pneumonia, unspecified organism: Secondary | ICD-10-CM | POA: Diagnosis not present

## 2021-05-21 DIAGNOSIS — N179 Acute kidney failure, unspecified: Secondary | ICD-10-CM | POA: Diagnosis not present

## 2021-05-21 DIAGNOSIS — R0602 Shortness of breath: Secondary | ICD-10-CM | POA: Diagnosis not present

## 2021-05-21 DIAGNOSIS — R410 Disorientation, unspecified: Secondary | ICD-10-CM | POA: Diagnosis not present

## 2021-05-21 DIAGNOSIS — R4182 Altered mental status, unspecified: Secondary | ICD-10-CM | POA: Diagnosis not present

## 2021-05-21 DIAGNOSIS — T68XXXA Hypothermia, initial encounter: Secondary | ICD-10-CM | POA: Diagnosis not present

## 2021-05-21 DIAGNOSIS — B338 Other specified viral diseases: Secondary | ICD-10-CM | POA: Diagnosis not present

## 2021-05-21 DIAGNOSIS — Z4682 Encounter for fitting and adjustment of non-vascular catheter: Secondary | ICD-10-CM | POA: Diagnosis not present

## 2021-05-21 DIAGNOSIS — R930 Abnormal findings on diagnostic imaging of skull and head, not elsewhere classified: Secondary | ICD-10-CM | POA: Diagnosis not present

## 2021-05-21 DIAGNOSIS — J439 Emphysema, unspecified: Secondary | ICD-10-CM | POA: Diagnosis not present

## 2021-05-21 DIAGNOSIS — U071 COVID-19: Secondary | ICD-10-CM | POA: Diagnosis not present

## 2021-05-21 DIAGNOSIS — I6782 Cerebral ischemia: Secondary | ICD-10-CM | POA: Diagnosis not present

## 2021-05-21 DIAGNOSIS — E869 Volume depletion, unspecified: Secondary | ICD-10-CM | POA: Diagnosis not present

## 2021-05-21 DIAGNOSIS — G9389 Other specified disorders of brain: Secondary | ICD-10-CM | POA: Diagnosis not present

## 2021-05-21 DIAGNOSIS — J9691 Respiratory failure, unspecified with hypoxia: Secondary | ICD-10-CM | POA: Diagnosis not present

## 2021-05-21 DIAGNOSIS — I700 Atherosclerosis of aorta: Secondary | ICD-10-CM | POA: Diagnosis not present

## 2021-05-21 DIAGNOSIS — G319 Degenerative disease of nervous system, unspecified: Secondary | ICD-10-CM | POA: Diagnosis not present

## 2021-05-21 DIAGNOSIS — J441 Chronic obstructive pulmonary disease with (acute) exacerbation: Secondary | ICD-10-CM | POA: Diagnosis not present

## 2021-05-21 DIAGNOSIS — R933 Abnormal findings on diagnostic imaging of other parts of digestive tract: Secondary | ICD-10-CM | POA: Diagnosis not present

## 2021-05-21 DIAGNOSIS — R918 Other nonspecific abnormal finding of lung field: Secondary | ICD-10-CM | POA: Diagnosis not present

## 2021-05-21 DIAGNOSIS — N200 Calculus of kidney: Secondary | ICD-10-CM | POA: Diagnosis not present

## 2021-05-21 DIAGNOSIS — B974 Respiratory syncytial virus as the cause of diseases classified elsewhere: Secondary | ICD-10-CM | POA: Diagnosis not present

## 2021-05-21 DIAGNOSIS — J9690 Respiratory failure, unspecified, unspecified whether with hypoxia or hypercapnia: Secondary | ICD-10-CM | POA: Diagnosis not present

## 2021-05-22 DIAGNOSIS — Z4682 Encounter for fitting and adjustment of non-vascular catheter: Secondary | ICD-10-CM | POA: Diagnosis not present

## 2021-05-22 DIAGNOSIS — J449 Chronic obstructive pulmonary disease, unspecified: Secondary | ICD-10-CM | POA: Diagnosis not present

## 2021-05-22 DIAGNOSIS — Z87891 Personal history of nicotine dependence: Secondary | ICD-10-CM | POA: Diagnosis not present

## 2021-05-22 DIAGNOSIS — R918 Other nonspecific abnormal finding of lung field: Secondary | ICD-10-CM | POA: Diagnosis not present

## 2021-05-22 DIAGNOSIS — R0602 Shortness of breath: Secondary | ICD-10-CM | POA: Diagnosis not present

## 2021-05-22 DIAGNOSIS — R4182 Altered mental status, unspecified: Secondary | ICD-10-CM | POA: Diagnosis not present

## 2021-05-23 DIAGNOSIS — J9601 Acute respiratory failure with hypoxia: Secondary | ICD-10-CM | POA: Diagnosis not present

## 2021-05-23 DIAGNOSIS — J9612 Chronic respiratory failure with hypercapnia: Secondary | ICD-10-CM | POA: Diagnosis not present

## 2021-05-23 DIAGNOSIS — F4310 Post-traumatic stress disorder, unspecified: Secondary | ICD-10-CM | POA: Diagnosis present

## 2021-05-23 DIAGNOSIS — Z515 Encounter for palliative care: Secondary | ICD-10-CM | POA: Diagnosis not present

## 2021-05-23 DIAGNOSIS — T68XXXA Hypothermia, initial encounter: Secondary | ICD-10-CM | POA: Diagnosis not present

## 2021-05-23 DIAGNOSIS — E039 Hypothyroidism, unspecified: Secondary | ICD-10-CM | POA: Diagnosis present

## 2021-05-23 DIAGNOSIS — R9082 White matter disease, unspecified: Secondary | ICD-10-CM | POA: Diagnosis not present

## 2021-05-23 DIAGNOSIS — J90 Pleural effusion, not elsewhere classified: Secondary | ICD-10-CM | POA: Diagnosis not present

## 2021-05-23 DIAGNOSIS — Z7189 Other specified counseling: Secondary | ICD-10-CM | POA: Diagnosis not present

## 2021-05-23 DIAGNOSIS — R0602 Shortness of breath: Secondary | ICD-10-CM | POA: Diagnosis not present

## 2021-05-23 DIAGNOSIS — R627 Adult failure to thrive: Secondary | ICD-10-CM | POA: Diagnosis present

## 2021-05-23 DIAGNOSIS — G9340 Encephalopathy, unspecified: Secondary | ICD-10-CM | POA: Diagnosis not present

## 2021-05-23 DIAGNOSIS — M79604 Pain in right leg: Secondary | ICD-10-CM | POA: Diagnosis not present

## 2021-05-23 DIAGNOSIS — G9341 Metabolic encephalopathy: Secondary | ICD-10-CM | POA: Diagnosis present

## 2021-05-23 DIAGNOSIS — M79605 Pain in left leg: Secondary | ICD-10-CM | POA: Diagnosis not present

## 2021-05-23 DIAGNOSIS — I252 Old myocardial infarction: Secondary | ICD-10-CM | POA: Diagnosis not present

## 2021-05-23 DIAGNOSIS — J9621 Acute and chronic respiratory failure with hypoxia: Secondary | ICD-10-CM | POA: Diagnosis present

## 2021-05-23 DIAGNOSIS — R230 Cyanosis: Secondary | ICD-10-CM | POA: Diagnosis present

## 2021-05-23 DIAGNOSIS — J121 Respiratory syncytial virus pneumonia: Secondary | ICD-10-CM | POA: Diagnosis present

## 2021-05-23 DIAGNOSIS — E876 Hypokalemia: Secondary | ICD-10-CM | POA: Diagnosis present

## 2021-05-23 DIAGNOSIS — J9811 Atelectasis: Secondary | ICD-10-CM | POA: Diagnosis not present

## 2021-05-23 DIAGNOSIS — R6521 Severe sepsis with septic shock: Secondary | ICD-10-CM | POA: Diagnosis present

## 2021-05-23 DIAGNOSIS — Z95828 Presence of other vascular implants and grafts: Secondary | ICD-10-CM | POA: Diagnosis not present

## 2021-05-23 DIAGNOSIS — Z87891 Personal history of nicotine dependence: Secondary | ICD-10-CM | POA: Diagnosis not present

## 2021-05-23 DIAGNOSIS — J9611 Chronic respiratory failure with hypoxia: Secondary | ICD-10-CM | POA: Diagnosis not present

## 2021-05-23 DIAGNOSIS — G4733 Obstructive sleep apnea (adult) (pediatric): Secondary | ICD-10-CM | POA: Diagnosis present

## 2021-05-23 DIAGNOSIS — J439 Emphysema, unspecified: Secondary | ICD-10-CM | POA: Diagnosis not present

## 2021-05-23 DIAGNOSIS — R918 Other nonspecific abnormal finding of lung field: Secondary | ICD-10-CM | POA: Diagnosis not present

## 2021-05-23 DIAGNOSIS — Z4682 Encounter for fitting and adjustment of non-vascular catheter: Secondary | ICD-10-CM | POA: Diagnosis not present

## 2021-05-23 DIAGNOSIS — E871 Hypo-osmolality and hyponatremia: Secondary | ICD-10-CM | POA: Diagnosis present

## 2021-05-23 DIAGNOSIS — R9430 Abnormal result of cardiovascular function study, unspecified: Secondary | ICD-10-CM | POA: Diagnosis not present

## 2021-05-23 DIAGNOSIS — D6869 Other thrombophilia: Secondary | ICD-10-CM | POA: Diagnosis present

## 2021-05-23 DIAGNOSIS — J9691 Respiratory failure, unspecified with hypoxia: Secondary | ICD-10-CM | POA: Diagnosis not present

## 2021-05-23 DIAGNOSIS — Z66 Do not resuscitate: Secondary | ICD-10-CM | POA: Diagnosis not present

## 2021-05-23 DIAGNOSIS — N170 Acute kidney failure with tubular necrosis: Secondary | ICD-10-CM | POA: Diagnosis present

## 2021-05-23 DIAGNOSIS — B974 Respiratory syncytial virus as the cause of diseases classified elsewhere: Secondary | ICD-10-CM | POA: Diagnosis not present

## 2021-05-23 DIAGNOSIS — A419 Sepsis, unspecified organism: Secondary | ICD-10-CM | POA: Diagnosis present

## 2021-05-23 DIAGNOSIS — I5020 Unspecified systolic (congestive) heart failure: Secondary | ICD-10-CM | POA: Diagnosis present

## 2021-05-23 DIAGNOSIS — E46 Unspecified protein-calorie malnutrition: Secondary | ICD-10-CM | POA: Diagnosis present

## 2021-05-23 DIAGNOSIS — Z9911 Dependence on respirator [ventilator] status: Secondary | ICD-10-CM | POA: Diagnosis not present

## 2021-05-23 DIAGNOSIS — U071 COVID-19: Secondary | ICD-10-CM | POA: Diagnosis present

## 2021-05-23 DIAGNOSIS — Z9981 Dependence on supplemental oxygen: Secondary | ICD-10-CM | POA: Diagnosis not present

## 2021-05-23 DIAGNOSIS — J441 Chronic obstructive pulmonary disease with (acute) exacerbation: Secondary | ICD-10-CM | POA: Diagnosis present

## 2021-05-23 DIAGNOSIS — Z79890 Hormone replacement therapy (postmenopausal): Secondary | ICD-10-CM | POA: Diagnosis not present

## 2021-05-23 DIAGNOSIS — Z9989 Dependence on other enabling machines and devices: Secondary | ICD-10-CM | POA: Diagnosis not present

## 2021-05-24 DIAGNOSIS — J9612 Chronic respiratory failure with hypercapnia: Secondary | ICD-10-CM | POA: Diagnosis not present

## 2021-05-24 DIAGNOSIS — Z7189 Other specified counseling: Secondary | ICD-10-CM | POA: Diagnosis not present

## 2021-05-24 DIAGNOSIS — Z515 Encounter for palliative care: Secondary | ICD-10-CM | POA: Diagnosis not present

## 2021-05-24 DIAGNOSIS — E46 Unspecified protein-calorie malnutrition: Secondary | ICD-10-CM | POA: Diagnosis present

## 2021-05-24 DIAGNOSIS — Z9989 Dependence on other enabling machines and devices: Secondary | ICD-10-CM | POA: Diagnosis not present

## 2021-05-24 DIAGNOSIS — E876 Hypokalemia: Secondary | ICD-10-CM | POA: Diagnosis present

## 2021-05-24 DIAGNOSIS — R0602 Shortness of breath: Secondary | ICD-10-CM | POA: Diagnosis not present

## 2021-05-24 DIAGNOSIS — J9601 Acute respiratory failure with hypoxia: Secondary | ICD-10-CM | POA: Diagnosis not present

## 2021-05-24 DIAGNOSIS — Z79890 Hormone replacement therapy (postmenopausal): Secondary | ICD-10-CM | POA: Diagnosis not present

## 2021-05-24 DIAGNOSIS — R230 Cyanosis: Secondary | ICD-10-CM | POA: Diagnosis present

## 2021-05-24 DIAGNOSIS — R9082 White matter disease, unspecified: Secondary | ICD-10-CM | POA: Diagnosis not present

## 2021-05-24 DIAGNOSIS — J9611 Chronic respiratory failure with hypoxia: Secondary | ICD-10-CM | POA: Diagnosis not present

## 2021-05-24 DIAGNOSIS — R9430 Abnormal result of cardiovascular function study, unspecified: Secondary | ICD-10-CM | POA: Diagnosis not present

## 2021-05-24 DIAGNOSIS — F4310 Post-traumatic stress disorder, unspecified: Secondary | ICD-10-CM | POA: Diagnosis present

## 2021-05-24 DIAGNOSIS — Z66 Do not resuscitate: Secondary | ICD-10-CM | POA: Diagnosis not present

## 2021-05-24 DIAGNOSIS — E039 Hypothyroidism, unspecified: Secondary | ICD-10-CM | POA: Diagnosis present

## 2021-05-24 DIAGNOSIS — A419 Sepsis, unspecified organism: Secondary | ICD-10-CM | POA: Diagnosis not present

## 2021-05-24 DIAGNOSIS — Z9981 Dependence on supplemental oxygen: Secondary | ICD-10-CM | POA: Diagnosis not present

## 2021-05-24 DIAGNOSIS — M79604 Pain in right leg: Secondary | ICD-10-CM | POA: Diagnosis not present

## 2021-05-24 DIAGNOSIS — E871 Hypo-osmolality and hyponatremia: Secondary | ICD-10-CM | POA: Diagnosis present

## 2021-05-24 DIAGNOSIS — G9341 Metabolic encephalopathy: Secondary | ICD-10-CM | POA: Diagnosis present

## 2021-05-24 DIAGNOSIS — I5020 Unspecified systolic (congestive) heart failure: Secondary | ICD-10-CM | POA: Diagnosis present

## 2021-05-24 DIAGNOSIS — Z9911 Dependence on respirator [ventilator] status: Secondary | ICD-10-CM | POA: Diagnosis not present

## 2021-05-24 DIAGNOSIS — N170 Acute kidney failure with tubular necrosis: Secondary | ICD-10-CM | POA: Diagnosis not present

## 2021-05-24 DIAGNOSIS — J9621 Acute and chronic respiratory failure with hypoxia: Secondary | ICD-10-CM | POA: Diagnosis present

## 2021-05-24 DIAGNOSIS — Z87891 Personal history of nicotine dependence: Secondary | ICD-10-CM | POA: Diagnosis not present

## 2021-05-24 DIAGNOSIS — J441 Chronic obstructive pulmonary disease with (acute) exacerbation: Secondary | ICD-10-CM | POA: Diagnosis not present

## 2021-05-24 DIAGNOSIS — R627 Adult failure to thrive: Secondary | ICD-10-CM | POA: Diagnosis present

## 2021-05-24 DIAGNOSIS — D6869 Other thrombophilia: Secondary | ICD-10-CM | POA: Diagnosis present

## 2021-05-24 DIAGNOSIS — Z95828 Presence of other vascular implants and grafts: Secondary | ICD-10-CM | POA: Diagnosis not present

## 2021-05-24 DIAGNOSIS — I252 Old myocardial infarction: Secondary | ICD-10-CM | POA: Diagnosis not present

## 2021-05-24 DIAGNOSIS — J121 Respiratory syncytial virus pneumonia: Secondary | ICD-10-CM | POA: Diagnosis not present

## 2021-05-24 DIAGNOSIS — U071 COVID-19: Secondary | ICD-10-CM | POA: Diagnosis not present

## 2021-05-24 DIAGNOSIS — R6521 Severe sepsis with septic shock: Secondary | ICD-10-CM | POA: Diagnosis not present

## 2021-05-24 DIAGNOSIS — G9340 Encephalopathy, unspecified: Secondary | ICD-10-CM | POA: Diagnosis not present

## 2021-05-24 DIAGNOSIS — M79605 Pain in left leg: Secondary | ICD-10-CM | POA: Diagnosis not present

## 2021-05-24 DIAGNOSIS — G4733 Obstructive sleep apnea (adult) (pediatric): Secondary | ICD-10-CM | POA: Diagnosis present

## 2021-06-01 DEATH — deceased
